# Patient Record
Sex: Male | Born: 1990 | Race: White | Hispanic: No | Marital: Single | Smoking: Never smoker
Health system: Southern US, Community
[De-identification: ages and names within clinical notes are randomized; demographics above are authoritative.]

## PROBLEM LIST (undated history)

## (undated) DIAGNOSIS — I471 Supraventricular tachycardia, unspecified: Secondary | ICD-10-CM

## (undated) DIAGNOSIS — I4891 Unspecified atrial fibrillation: Secondary | ICD-10-CM

## (undated) DIAGNOSIS — F419 Anxiety disorder, unspecified: Secondary | ICD-10-CM

## (undated) HISTORY — DX: Supraventricular tachycardia: I47.1

## (undated) HISTORY — DX: Supraventricular tachycardia, unspecified: I47.10

## (undated) HISTORY — PX: OTHER SURGICAL HISTORY: SHX169

## (undated) HISTORY — PX: WISDOM TOOTH EXTRACTION: SHX21

## (undated) HISTORY — DX: Anxiety disorder, unspecified: F41.9

---
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
## 2009-01-16 ENCOUNTER — Ambulatory Visit: Payer: Self-pay | Admitting: Gastroenterology

## 2010-02-01 ENCOUNTER — Ambulatory Visit
Admission: RE | Admit: 2010-02-01 | Discharge: 2010-02-01 | Payer: Self-pay | Source: Home / Self Care | Attending: Internal Medicine | Admitting: Internal Medicine

## 2010-02-01 ENCOUNTER — Other Ambulatory Visit: Payer: Self-pay | Admitting: Internal Medicine

## 2010-02-01 ENCOUNTER — Encounter: Payer: Self-pay | Admitting: Internal Medicine

## 2010-02-01 DIAGNOSIS — R55 Syncope and collapse: Secondary | ICD-10-CM | POA: Insufficient documentation

## 2010-02-01 DIAGNOSIS — R109 Unspecified abdominal pain: Secondary | ICD-10-CM | POA: Insufficient documentation

## 2010-02-01 DIAGNOSIS — R002 Palpitations: Secondary | ICD-10-CM | POA: Insufficient documentation

## 2010-02-01 LAB — CBC WITH DIFFERENTIAL/PLATELET
Basophils Absolute: 0 10*3/uL (ref 0.0–0.1)
Basophils Relative: 0.4 % (ref 0.0–3.0)
Eosinophils Absolute: 0 10*3/uL (ref 0.0–0.7)
Eosinophils Relative: 0.5 % (ref 0.0–5.0)
HCT: 42.9 % (ref 39.0–52.0)
Hemoglobin: 14.9 g/dL (ref 13.0–17.0)
Lymphocytes Relative: 27.8 % (ref 12.0–46.0)
Lymphs Abs: 1.2 10*3/uL (ref 0.7–4.0)
MCHC: 34.7 g/dL (ref 30.0–36.0)
MCV: 90.7 fl (ref 78.0–100.0)
Monocytes Absolute: 0.4 10*3/uL (ref 0.1–1.0)
Monocytes Relative: 9.3 % (ref 3.0–12.0)
Neutro Abs: 2.7 10*3/uL (ref 1.4–7.7)
Neutrophils Relative %: 62 % (ref 43.0–77.0)
Platelets: 179 10*3/uL (ref 150.0–400.0)
RBC: 4.73 Mil/uL (ref 4.22–5.81)
RDW: 13.4 % (ref 11.5–14.6)
WBC: 4.4 10*3/uL — ABNORMAL LOW (ref 4.5–10.5)

## 2010-02-01 LAB — BASIC METABOLIC PANEL
BUN: 13 mg/dL (ref 6–23)
CO2: 29 mEq/L (ref 19–32)
Calcium: 9 mg/dL (ref 8.4–10.5)
Chloride: 103 mEq/L (ref 96–112)
Creatinine, Ser: 0.8 mg/dL (ref 0.4–1.5)
GFR: 129.63 mL/min (ref >60.00)
Glucose, Bld: 77 mg/dL (ref 70–99)
Potassium: 3.8 mEq/L (ref 3.5–5.1)
Sodium: 139 mEq/L (ref 135–145)

## 2010-02-01 LAB — TSH: TSH: 1 u[IU]/mL (ref 0.35–5.50)

## 2010-02-02 ENCOUNTER — Encounter: Payer: Self-pay | Admitting: Cardiovascular Disease

## 2010-02-02 ENCOUNTER — Telehealth: Payer: Self-pay

## 2010-02-02 ENCOUNTER — Ambulatory Visit: Admission: RE | Admit: 2010-02-02 | Discharge: 2010-02-02 | Payer: Self-pay | Source: Home / Self Care

## 2010-02-06 ENCOUNTER — Encounter: Payer: Self-pay | Admitting: Internal Medicine

## 2010-02-06 ENCOUNTER — Telehealth: Payer: Self-pay

## 2010-02-07 ENCOUNTER — Telehealth: Payer: Self-pay

## 2010-02-28 ENCOUNTER — Telehealth (INDEPENDENT_AMBULATORY_CARE_PROVIDER_SITE_OTHER): Payer: Self-pay | Admitting: *Deleted

## 2010-03-01 NOTE — Assessment & Plan Note (Signed)
Summary: to be est/passed out 01-28-2009/njr   Vital Signs:  Patient profile:   20 year old male Weight:      142 pounds BMI:     20.45 Pulse rate:   68 / minute BP sitting:   120 / 80  (right arm)  Vitals Entered By: Kyung Rudd, CMA (February 01, 2010 1:57 PM) CC: c/o syncope episode 01/28/2010...was awakened by sharp bowel pain with sudden urge to use the restroom. Pt began to have heart palpitations and hot flash just before passing out and hitting his head. He's not sure of how long he was unconscious because he was alone. He says debated about calling ems but refrained. Pt notes he doesn't think he had a concussion from the fall.    Primary Care Provider:  Hiram Gash, MD   CC:  c/o syncope episode 01/28/2010...was awakened by sharp bowel pain with sudden urge to use the restroom. Pt began to have heart palpitations and hot flash just before passing out and hitting his head. He's not sure of how long he was unconscious because he was alone. He says debated about calling ems but refrained. Pt notes he doesn't think he had a concussion from the fall. Marland Kitchen  History of Present Illness: Patient presents to clinic as a workin for evaluation of passing out episode. States 1/2 at 5am awoke with periumbilical pain and attempted to have a BM. Experienced 20 secs of feeling hot/flushed followed by nonwitnessed LOC. May have struck head as has right forehead abrasions but denies ha, mental status change or focal neurologic deficit. Since that time has episodes of tachycardia and hard beats. Tachycardia quantified  ~90's - low 100's.  Has associated cp that occurs only with palpitations. Has chronic intermittent abdominal pain previously evaluated by GI. Not improved with by mouth medication.  Preventive Screening-Counseling & Management  Alcohol-Tobacco     Smoking Status: never     Smoking Cessation Counseling: no     Tobacco Counseling: not indicated; no tobacco use  Current Medications  (verified): 1)  None  Allergies (verified): 1)  ! Codeine Sulfate (Codeine Sulfate)  Past History:  Past medical, surgical, family and social histories (including risk factors) reviewed for relevance to current acute and chronic problems.  Past Medical History: Reviewed history from 11/13/2007 and no changes required. No past medical history  Past Surgical History: Reviewed history from 11/13/2007 and no changes required. Wisdom teeth extractions (4)  Family History: Reviewed history from 11/13/2007 and no changes required. Family History of Breast Cancer:Paternal grandmother Family History of Colon Cancer:Fathers grandmother  Social History: Reviewed history from 11/13/2007 and no changes required. Occupation: Archivist Patient has never smoked.  Alcohol Use - no Daily Caffeine Use 2 cups of sodas a day Illicit Drug Use - no Patient gets regular exercise. chews gum daily  Review of Systems      See HPI General:  Denies chills, fever, and sweats. ENT:  Denies decreased hearing, earache, hoarseness, and sinus pressure. CV:  Complains of chest pain or discomfort, fainting, and palpitations; denies lightheadness, near fainting, and shortness of breath with exertion. Resp:  Complains of chest discomfort; denies cough, shortness of breath, and wheezing. GI:  Complains of abdominal pain; denies bloody stools, change in bowel habits, constipation, dark tarry stools, diarrhea, nausea, and vomiting. Derm:  Denies changes in color of skin, flushing, and rash. Neuro:  Denies falling down, headaches, memory loss, numbness, poor balance, seizures, sensation of room spinning, tingling, tremors, visual disturbances,  and weakness.  Physical Exam  General:  Well-developed,well-nourished,in no acute distress; alert,appropriate and cooperative throughout examination Head:  Normocephalic and atraumatic without obvious abnormalities. No apparent alopecia or balding. Eyes:  pupils  equal, pupils round, corneas and lenses clear, and no injection.   Ears:  External ear exam shows no significant lesions or deformities.  Otoscopic examination reveals clear canals, tympanic membranes are intact bilaterally without bulging, retraction, inflammation or discharge. Hearing is grossly normal bilaterally. Nose:  External nasal examination shows no deformity or inflammation. Nasal mucosa are pink and moist without lesions or exudates. Mouth:  Oral mucosa and oropharynx without lesions or exudates.  Teeth in good repair. Neck:  No deformities, masses, or tenderness noted. Lungs:  Normal respiratory effort, chest expands symmetrically. Lungs are clear to auscultation, no crackles or wheezes. Heart:  Normal rate and regular rhythm. S1 and S2 normal without gallop, murmur, click, rub or other extra sounds. Abdomen:  Bowel sounds positive,abdomen soft and non-tender without masses, organomegaly or hernias noted. Neurologic:  alert & oriented X3, cranial nerves II-XII intact, strength normal in all extremities, and gait normal.   Skin:  turgor normal, color normal, and no rashes.     Impression & Recommendations:  Problem # 1:  SYNCOPE (ICD-780.2) Assessment New EKG obtained demosntrates NSR 72 with nl intervals. ?LVH by voltage. Obtain labs and schedule 24 holter monitor given continuing palpitations. Pt states is moving out of state in several days. Strongly encouraged to establish PMD locally. If followup EKG c/w LVH recommend echocardiogram.   Orders: Cardiology Referral (Cardiology) Specimen Handling (16109) Venipuncture (60454) TLB-BMP (Basic Metabolic Panel-BMET) (80048-METABOL) TLB-CBC Platelet - w/Differential (85025-CBCD) TLB-TSH (Thyroid Stimulating Hormone) (84443-TSH)  Problem # 2:  ABDOMINAL PAIN, CHRONIC (ICD-789.00) Assessment: Unchanged GI records reviewed. Persistent symptoms failing by mouth meds. GI previous mentioned colonoscopy in this case. Pt states  understanding. Again is moving out of state in very near future and will f/u locally for further evaluation.   Orders Added: 1)  Cardiology Referral [Cardiology] 2)  Specimen Handling [99000] 3)  Venipuncture [36415] 4)  TLB-BMP (Basic Metabolic Panel-BMET) [80048-METABOL] 5)  TLB-CBC Platelet - w/Differential [85025-CBCD] 6)  TLB-TSH (Thyroid Stimulating Hormone) [84443-TSH] 7)  New Patient Level IV [09811]

## 2010-03-01 NOTE — Procedures (Signed)
Summary: Summary Report  Summary Report   Imported By: Erle Crocker 02/14/2010 16:24:59  _____________________________________________________________________  External Attachment:    Type:   Image     Comment:   External Document

## 2010-03-01 NOTE — Progress Notes (Signed)
Summary: Pt req Toprol to be sent to [facility] in [facility] Note Call from Patient Call back at [phone] cell   Caller: Patient Summary of Call: Pt called and said that he is in [place] for 6 months, so he is req Toprol XL 25mg  to be forward to [facility] [address] Kathie Rhodes. in [place] [phone]  Phone # [phone]  . Pls notify pt when this has been done.  Initial call taken by: Lucy Antigua,  February 07, 2010 10:09 AM  Follow-up for Phone Call        rx escribed and pt aware Follow-up by: Kyung Rudd, CMA,  February 07, 2010 11:48 AM    New/Updated Medications: TOPROL XL 25 MG XR24H-TAB (METOPROLOL SUCCINATE) once daily Prescriptions: TOPROL XL 25 MG XR24H-TAB (METOPROLOL SUCCINATE) once daily  #30 x 1   Entered by:   Kyung Rudd, CMA   Authorized by:   Edwyna Perfect MD   Signed by:   Kyung Rudd, CMA on 02/07/2010   Method used:   Electronically to        [facility])       [address]       [place], [place]  [address]       Ph: [phone]       Fax:    RxID:   [id number]

## 2010-03-01 NOTE — Progress Notes (Signed)
----   Converted from flag ---- ---- 02/06/2010 12:13 PM, Edwyna Perfect MD wrote: cardiologist read his holter monitor. is having episodes of SVT (rapid heart rhythm that can lead to passing out, dizziness and chest pain.)  1) recommend beginning toprol xl 25mg  once daily to try and reduce attacks. 2) absolutely needs to get local doctor (currently in [place]) asap. get him to give a name and address so we can forward his note, labs and holter monitor report. ------------------------------  Phone Note Outgoing Call   Call placed by: Kyung Rudd, CMA,  February 06, 2010 2:12 PM Call placed to: Patient Details for Reason: results Summary of Call: pt aware of all. Is currently in [place] traveling to [place]. Notes that he will get an appt with a doctor asap and will call us back with the offc and pharm info.  Initial call taken by: Kyung Rudd, CMA,  February 06, 2010 2:14 PM

## 2010-03-01 NOTE — Procedures (Signed)
Summary: summary report  summary report   Imported By: Mirna Mires 02/06/2010 10:39:51  _____________________________________________________________________  External Attachment:    Type:   Image     Comment:   External Document

## 2010-03-01 NOTE — Progress Notes (Signed)
----   Converted from flag ---- ---- 02/01/2010 5:35 PM, Edwyna Perfect MD wrote: pls notify labs nl ------------------------------  Phone Note Outgoing Call   Call placed by: Kyung Rudd, CMA,  February 02, 2010 9:43 AM Call placed to: Patient Details for Reason: lab results Summary of Call: phone number listed in chart is continuously busy. will continue to try calling Initial call taken by: Kyung Rudd, CMA,  February 02, 2010 9:44 AM  Follow-up for Phone Call        pt aware Follow-up by: Kyung Rudd, CMA,  February 02, 2010 1:37 PM

## 2010-03-07 NOTE — Progress Notes (Signed)
  Holter Faxed to [facility] @ [phone] ROI On file  [facility]  February 28, 2010 9:09 AM

## 2010-03-07 NOTE — Procedures (Signed)
Summary: Summary Report  Summary Report   Imported By: Erle Crocker 02/28/2010 14:17:29  _____________________________________________________________________  External Attachment:    Type:   Image     Comment:   External Document

## 2011-10-04 ENCOUNTER — Encounter: Payer: Self-pay | Admitting: Cardiovascular Disease

## 2017-12-24 ENCOUNTER — Ambulatory Visit: Payer: BC Managed Care – PPO | Admitting: Psychology

## 2017-12-24 DIAGNOSIS — F320 Major depressive disorder, single episode, mild: Secondary | ICD-10-CM | POA: Diagnosis not present

## 2018-01-07 ENCOUNTER — Ambulatory Visit: Payer: BC Managed Care – PPO | Admitting: Psychology

## 2018-01-19 ENCOUNTER — Ambulatory Visit: Payer: BC Managed Care – PPO | Admitting: Psychology

## 2018-01-19 DIAGNOSIS — F320 Major depressive disorder, single episode, mild: Secondary | ICD-10-CM | POA: Diagnosis not present

## 2018-02-03 ENCOUNTER — Ambulatory Visit: Payer: BC Managed Care – PPO | Admitting: Psychology

## 2018-02-03 DIAGNOSIS — F320 Major depressive disorder, single episode, mild: Secondary | ICD-10-CM | POA: Diagnosis not present

## 2018-02-17 ENCOUNTER — Ambulatory Visit: Payer: BC Managed Care – PPO | Admitting: Psychology

## 2018-02-17 DIAGNOSIS — F320 Major depressive disorder, single episode, mild: Secondary | ICD-10-CM | POA: Diagnosis not present

## 2019-12-14 DIAGNOSIS — Z03818 Encounter for observation for suspected exposure to other biological agents ruled out: Secondary | ICD-10-CM | POA: Diagnosis not present

## 2019-12-14 DIAGNOSIS — Z20822 Contact with and (suspected) exposure to covid-19: Secondary | ICD-10-CM | POA: Diagnosis not present

## 2019-12-15 DIAGNOSIS — F419 Anxiety disorder, unspecified: Secondary | ICD-10-CM | POA: Diagnosis not present

## 2019-12-15 DIAGNOSIS — R000 Tachycardia, unspecified: Secondary | ICD-10-CM | POA: Diagnosis not present

## 2019-12-15 DIAGNOSIS — F321 Major depressive disorder, single episode, moderate: Secondary | ICD-10-CM | POA: Diagnosis not present

## 2019-12-15 DIAGNOSIS — Z13220 Encounter for screening for lipoid disorders: Secondary | ICD-10-CM | POA: Diagnosis not present

## 2019-12-15 DIAGNOSIS — Z0000 Encounter for general adult medical examination without abnormal findings: Secondary | ICD-10-CM | POA: Diagnosis not present

## 2019-12-21 DIAGNOSIS — Z20822 Contact with and (suspected) exposure to covid-19: Secondary | ICD-10-CM | POA: Diagnosis not present

## 2020-02-08 DIAGNOSIS — Z1152 Encounter for screening for COVID-19: Secondary | ICD-10-CM | POA: Diagnosis not present

## 2020-06-14 DIAGNOSIS — E7800 Pure hypercholesterolemia, unspecified: Secondary | ICD-10-CM | POA: Diagnosis not present

## 2020-07-12 DIAGNOSIS — J301 Allergic rhinitis due to pollen: Secondary | ICD-10-CM | POA: Diagnosis not present

## 2020-07-12 DIAGNOSIS — J3089 Other allergic rhinitis: Secondary | ICD-10-CM | POA: Diagnosis not present

## 2020-07-12 DIAGNOSIS — T781XXD Other adverse food reactions, not elsewhere classified, subsequent encounter: Secondary | ICD-10-CM | POA: Diagnosis not present

## 2020-07-12 DIAGNOSIS — J3081 Allergic rhinitis due to animal (cat) (dog) hair and dander: Secondary | ICD-10-CM | POA: Diagnosis not present

## 2020-09-20 DIAGNOSIS — D2261 Melanocytic nevi of right upper limb, including shoulder: Secondary | ICD-10-CM | POA: Diagnosis not present

## 2020-09-20 DIAGNOSIS — D224 Melanocytic nevi of scalp and neck: Secondary | ICD-10-CM | POA: Diagnosis not present

## 2020-09-20 DIAGNOSIS — D485 Neoplasm of uncertain behavior of skin: Secondary | ICD-10-CM | POA: Diagnosis not present

## 2020-09-20 DIAGNOSIS — D225 Melanocytic nevi of trunk: Secondary | ICD-10-CM | POA: Diagnosis not present

## 2020-09-20 DIAGNOSIS — L400 Psoriasis vulgaris: Secondary | ICD-10-CM | POA: Diagnosis not present

## 2020-10-10 ENCOUNTER — Other Ambulatory Visit: Payer: Self-pay

## 2020-10-10 ENCOUNTER — Ambulatory Visit: Payer: BC Managed Care – PPO | Admitting: Psychology

## 2020-10-10 DIAGNOSIS — F320 Major depressive disorder, single episode, mild: Secondary | ICD-10-CM

## 2020-10-23 DIAGNOSIS — F902 Attention-deficit hyperactivity disorder, combined type: Secondary | ICD-10-CM | POA: Diagnosis not present

## 2020-10-23 DIAGNOSIS — F419 Anxiety disorder, unspecified: Secondary | ICD-10-CM | POA: Diagnosis not present

## 2020-10-23 DIAGNOSIS — Z79899 Other long term (current) drug therapy: Secondary | ICD-10-CM | POA: Diagnosis not present

## 2020-10-23 DIAGNOSIS — R41840 Attention and concentration deficit: Secondary | ICD-10-CM | POA: Diagnosis not present

## 2020-10-24 DIAGNOSIS — F902 Attention-deficit hyperactivity disorder, combined type: Secondary | ICD-10-CM | POA: Diagnosis not present

## 2020-10-31 ENCOUNTER — Ambulatory Visit: Payer: BC Managed Care – PPO | Admitting: Psychology

## 2020-11-01 ENCOUNTER — Ambulatory Visit (INDEPENDENT_AMBULATORY_CARE_PROVIDER_SITE_OTHER): Payer: BC Managed Care – PPO | Admitting: Psychology

## 2020-11-01 DIAGNOSIS — F320 Major depressive disorder, single episode, mild: Secondary | ICD-10-CM

## 2020-11-24 ENCOUNTER — Ambulatory Visit (INDEPENDENT_AMBULATORY_CARE_PROVIDER_SITE_OTHER): Payer: BC Managed Care – PPO | Admitting: Psychology

## 2020-11-24 DIAGNOSIS — F320 Major depressive disorder, single episode, mild: Secondary | ICD-10-CM

## 2020-11-27 DIAGNOSIS — F419 Anxiety disorder, unspecified: Secondary | ICD-10-CM | POA: Diagnosis not present

## 2020-11-27 DIAGNOSIS — Z79899 Other long term (current) drug therapy: Secondary | ICD-10-CM | POA: Diagnosis not present

## 2020-11-27 DIAGNOSIS — F902 Attention-deficit hyperactivity disorder, combined type: Secondary | ICD-10-CM | POA: Diagnosis not present

## 2020-11-28 DIAGNOSIS — F902 Attention-deficit hyperactivity disorder, combined type: Secondary | ICD-10-CM | POA: Diagnosis not present

## 2020-12-12 ENCOUNTER — Ambulatory Visit: Payer: BC Managed Care – PPO | Admitting: Psychology

## 2020-12-12 DIAGNOSIS — F320 Major depressive disorder, single episode, mild: Secondary | ICD-10-CM

## 2020-12-15 DIAGNOSIS — I4729 Other ventricular tachycardia: Secondary | ICD-10-CM | POA: Diagnosis not present

## 2020-12-19 ENCOUNTER — Encounter: Payer: Self-pay | Admitting: Internal Medicine

## 2020-12-19 ENCOUNTER — Other Ambulatory Visit: Payer: Self-pay

## 2020-12-19 ENCOUNTER — Ambulatory Visit (INDEPENDENT_AMBULATORY_CARE_PROVIDER_SITE_OTHER): Payer: BC Managed Care – PPO

## 2020-12-19 ENCOUNTER — Ambulatory Visit (INDEPENDENT_AMBULATORY_CARE_PROVIDER_SITE_OTHER): Payer: BC Managed Care – PPO | Admitting: Internal Medicine

## 2020-12-19 VITALS — BP 122/76 | HR 88 | Resp 20 | Ht 71.0 in | Wt 209.2 lb

## 2020-12-19 DIAGNOSIS — I471 Supraventricular tachycardia: Secondary | ICD-10-CM

## 2020-12-19 NOTE — Progress Notes (Signed)
Cardiology Office Note:    Date:  12/19/2020   ID:  Harry Anderson, DOB 1990-08-10, MRN [id number]  PCP:  Harry Bussing, MD   [facility] Providers Cardiologist:  None     Referring MD: Harry Palmer, MD   No chief complaint on file. Hx of SVT  History of Present Illness:    Harry Anderson is a 30 y.o. male with a hx of anxiety, ADHD  Reported syncopal episode in 2012. He woke up with bowel pain and sudden urge to defecate and noted palpitations and hot flash. He then reported passing out and loosing consciousness. He had a holter monitor read has baseline rhythm as sinus, with sinus bradycardia as well as episode of SVT. He was prescribed metoprolol XL 25 mg daily. He was told to stop it in  [place] in 2012 and it was stopped. He saw a cardiologist in 2015-2016 in [place]. He had a repeat 4 week monitor. Noted runs of sinus tachycardia in the evening 08/2014. Rates up to 157 noted as sinus tachycardia. 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 He had a bubble study in [place]. No records of his echocardiogram.   He is on adderral. He has increased heart rates since recently starting.  His metoprolol was increased to 25 mg XL. When he does not take it  he feels palpitations. He denies heart racing with LH, dizziness, no repeat syncope.  He drinks 2-3x per week. He drinks 2 servings of caffeine per day. He's a teacher  Holter 2012- 24 runs of SVT. Max rate 115 bpm. Longets run was 24 beats.  Family Hx: His mother had an aortic valve and root replacement. She was diagnosed with afib. S/p ppm. His sister has POTs and Ehrlers danlos. No hx of SCD in 1st degree relatives  12/15/2020 Crt 0.93 K 4.3 Hgb 15 LDL 141 (age too young to assess risk with pooled cohort equation; not in familial hyperlipidemia range) TC 215  TSH 1.27  Past Medical History:  Diagnosis Date   Anxiety     SVT (supraventricular tachycardia) (HCC)     Past Surgical History:  Procedure Laterality Date   Wisdom teeth removal     WISDOM TOOTH EXTRACTION      Current Medications: Current Meds  Medication Sig   amphetamine-dextroamphetamine (ADDERALL XR) 30 MG 24 hr capsule Take 30 mg by mouth daily.   buPROPion (WELLBUTRIN) 100 MG tablet Take 100 mg by mouth daily.   metoprolol succinate (TOPROL-XL) 25 MG 24 hr tablet Take 25 mg by mouth daily. Pt takes 12.5 mg daily   sertraline (ZOLOFT) 100 MG tablet Take 100 mg by mouth daily.     Allergies:   Codeine sulfate   Social History   Socioeconomic History   Marital status: Single    Spouse name: Not on file   Number of children: Not on file   Years of education: Not on file   Highest education level: Not on file  Occupational History   Not on file  Tobacco Use   Smoking status: Never   Smokeless tobacco: Not on file  Substance and Sexual Activity   Alcohol use: No   Drug use: Not on file   Sexual activity: Never  Other Topics Concern   Not on file  Social History Narrative   Not on file   Social Determinants of Health  Financial Resource Strain: Not on file  Food Insecurity: Not on file  Transportation Needs: Not on file  Physical Activity: Not on file  Stress: Not on file  Social Connections: Not on file     Family History: The patient's family history includes Atrial fibrillation in his mother; Breast cancer in an other family member; Colon cancer in an other family member; Ehlers-Danlos syndrome in his sister; Heart attack in his maternal grandfather.  ROS:   Please see the history of present illness.     All other systems reviewed and are negative.  EKGs/Labs/Other Studies Reviewed:    The following studies were reviewed today:   EKG:  EKG is  ordered today.  The ekg ordered today demonstrates   NSR, no pre-excitation, Qtc 433 ms  Recent Labs: No results found for requested labs within last 8760  hours.  Recent Lipid Panel No results found for: CHOL, TRIG, HDL, CHOLHDL, VLDL, LDLCALC, LDLDIRECT   Risk Assessment/Calculations:           Physical Exam:    VS:   Vitals:   12/19/20 1121  BP: 122/76  Pulse: 88  Resp: 20     Wt Readings from Last 3 Encounters:  12/19/20 209 lb 3.2 oz (94.9 kg)  02/01/10 142 lb (64.4 kg)     GEN:  Well nourished, well developed in no acute distress HEENT: Normal NECK: No JVD; No carotid bruits LYMPHATICS: No lymphadenopathy CARDIAC: RRR, no murmurs, rubs, gallops RESPIRATORY:  Clear to auscultation without rales, wheezing or rhonchi  ABDOMEN: Soft, non-tender, non-distended MUSCULOSKELETAL:  No edema; No deformity  SKIN: Warm and dry NEUROLOGIC:  Alert and oriented x 3 PSYCHIATRIC:  Normal affect   ASSESSMENT:    #SVT: noted episode of SVT on holter monitor 10 years ago. He had a brief run up to 24 beats and max rate only 115 bpm. He's had no recent syncopal episode. The episode in 2012 is consistent with vasovagal syncope. He notes that with stopping metoprolol he notes some notable HR. He takes adderal at times.Will plan to stop his metoprolol and can repeat a 14  day monitor. He plans to hold his adderal as well during this period.  #Family Hx: considering his mother had aortic valve and root replacement and his sister has ehlers danlos will plan to get TTE to look  for BAV/aortic root.  PLAN:    In order of problems listed above:  Stop Metoprolol, going to hold adderall  14 days cardiac event monitor to ensure no persistent runs of SVT off of metop and adderal TTE Follow up in 3 months      Medication Adjustments/Labs and Tests Ordered: Current medicines are reviewed at length with the patient today.  Concerns regarding medicines are outlined above.    Signed, Maisie Fus, MD  12/19/2020 12:00 PM    [facility]

## 2020-12-19 NOTE — Patient Instructions (Signed)
Medication Instructions:  The current medical regimen is effective;  continue present plan and medications.  *If you need a refill on your cardiac medications before your next appointment, please call your pharmacy*   Testing/Procedures: Echocardiogram - Your physician has requested that you have an echocardiogram. Echocardiography is a painless test that uses sound waves to create images of your heart. It provides your doctor with information about the size and shape of your heart and how well your heart's chambers and valves are working. This procedure takes approximately one hour. There are no restrictions for this procedure. This will be performed at either our [facility] location - [address]  -or- [facility] location [facility] 2nd floor.  ZIO XT- Long Term Monitor Instructions  Your physician has requested you wear a ZIO patch monitor for 14 days.  This is a single patch monitor. [employer] supplies one patch monitor per enrollment. Additional stickers are not available. Please do not apply patch if you will be having a Nuclear Stress Test,  Echocardiogram, Cardiac CT, MRI, or Chest Xray during the period you would be wearing the  monitor. The patch cannot be worn during these tests. You cannot remove and re-apply the  ZIO XT patch monitor.  Your ZIO patch monitor will be mailed 3 day USPS to your address on file. It may take 3-5 days  to receive your monitor after you have been enrolled.  Once you have received your monitor, please review the enclosed instructions. Your monitor  has already been registered assigning a specific monitor serial # to you.  Billing and Patient Assistance Program Information  We have supplied [employer] with any of your insurance information on file for billing purposes. [employer] offers a sliding scale Patient Assistance Program for patients that do not have  insurance, or whose insurance does not completely cover the cost of the  ZIO monitor.  You must apply for the Patient Assistance Program to qualify for this discounted rate.  To apply, please call [employer] at [phone], select option 4, select option 2, ask to apply for  Patient Assistance Program. Meredeth Ide will ask your household income, and how many people  are in your household. They will quote your out-of-pocket cost based on that information.  [employer] will also be able to set up a 17-month, interest-free payment plan if needed.  Applying the monitor   Shave hair from upper left chest.  Hold abrader disc by orange tab. Rub abrader in 40 strokes over the upper left chest as  indicated in your monitor instructions.  Clean area with 4 enclosed alcohol pads. Let dry.  Apply patch as indicated in monitor instructions. Patch will be placed under collarbone on left  side of chest with arrow pointing upward.  Rub patch adhesive wings for 2 minutes. Remove white label marked "1". Remove the white  label marked "2". Rub patch adhesive wings for 2 additional minutes.  While looking in a mirror, press and release button in center of patch. A small green light will  flash 3-4 times. This will be your only indicator that the monitor has been turned on.  Do not shower for the first 24 hours. You may shower after the first 24 hours.  Press the button if you feel a symptom. You will hear a small click. Record Date, Time and  Symptom in the Patient Logbook.  When you are ready to remove the patch, follow instructions on the last 2 pages of Patient  Logbook. Stick patch monitor onto the last page of Patient Logbook.  Place Patient Logbook in the blue and white box. Use locking tab on box and tape box closed  securely. The blue and white box has prepaid postage on it. Please place it in the mailbox as  soon as possible. Your physician should have your test results approximately 7 days after the  monitor has been mailed back to [facility]  Call [facility] Customer  Care at [phone] if you have questions regarding  your ZIO XT patch monitor. Call them immediately if you see an orange light blinking on your  monitor.  If your monitor falls off in less than 4 days, contact our Monitor department at [phone].  If your monitor becomes loose or falls off after 4 days call [employer] at [phone] for  suggestions on securing your monitor    Follow-Up: At [facility], you and your health needs are our priority.  As part of our continuing mission to provide you with exceptional heart care, we have created designated Provider Care Teams.  These Care Teams include your primary Cardiologist (physician) and Advanced Practice Providers (APPs -  Physician Assistants and Nurse Practitioners) who all work together to provide you with the care you need, when you need it.  We recommend signing up for the patient portal called "MyChart".  Sign up information is provided on this After Visit Summary.  MyChart is used to connect with patients for Virtual Visits (Telemedicine).  Patients are able to view lab/test results, encounter notes, upcoming appointments, etc.  Non-urgent messages can be sent to your provider as well.   To learn more about what you can do with MyChart, go to [email or web address].    Your next appointment:   3 month(s)  The format for your next appointment:   In Person  Provider:   Carolan Clines, MD

## 2020-12-19 NOTE — Progress Notes (Unsigned)
Enrolled patient for a 14 day Zio XT  monitor to be mailed to patients home  °

## 2020-12-22 DIAGNOSIS — I471 Supraventricular tachycardia: Secondary | ICD-10-CM

## 2020-12-26 ENCOUNTER — Ambulatory Visit: Payer: BC Managed Care – PPO | Admitting: Psychology

## 2021-01-01 ENCOUNTER — Other Ambulatory Visit (HOSPITAL_BASED_OUTPATIENT_CLINIC_OR_DEPARTMENT_OTHER): Payer: BC Managed Care – PPO

## 2021-01-04 ENCOUNTER — Encounter: Payer: Self-pay | Admitting: Internal Medicine

## 2021-01-08 ENCOUNTER — Ambulatory Visit (INDEPENDENT_AMBULATORY_CARE_PROVIDER_SITE_OTHER): Payer: BC Managed Care – PPO

## 2021-01-08 ENCOUNTER — Other Ambulatory Visit: Payer: Self-pay

## 2021-01-08 DIAGNOSIS — I471 Supraventricular tachycardia: Secondary | ICD-10-CM | POA: Diagnosis not present

## 2021-01-08 LAB — ECHOCARDIOGRAM COMPLETE
AR max vel: 3.15 cm2
AV Area VTI: 3.21 cm2
AV Area mean vel: 3.44 cm2
AV Mean grad: 2 mmHg
AV Peak grad: 4.2 mmHg
Ao pk vel: 1.03 m/s
Area-P 1/2: 3.81 cm2
Calc EF: 68.5 %
S' Lateral: 3.01 cm
Single Plane A2C EF: 70.5 %
Single Plane A4C EF: 68.2 %

## 2021-01-09 ENCOUNTER — Ambulatory Visit (INDEPENDENT_AMBULATORY_CARE_PROVIDER_SITE_OTHER): Payer: BC Managed Care – PPO | Admitting: Psychology

## 2021-01-09 DIAGNOSIS — F320 Major depressive disorder, single episode, mild: Secondary | ICD-10-CM

## 2021-01-09 NOTE — Progress Notes (Addendum)
[facility] Counselor/Therapist Progress Note  Patient ID: Harry Anderson, MRN: [id number],    Date: 01/09/2021  Time Spent: 50 mins  Treatment Type: Individual Therapy  Reported Symptoms: Pt presents for session, in person, at our office and grants consent for session.  Pt shares that he has been OK since our last session; "no major crises with Thanksgiving."  Pt and [name] went to her father's home for Thanksgiving; his dad joined them but his mom did not.  Pt believes that his mom went to her sister's for the holiday.  Mental Status Exam: Appearance:  Casual     Behavior: Appropriate  Motor: Normal  Speech/Language:  Clear and Coherent  Affect: Appropriate  Mood: normal  Thought process: normal  Thought content:   WNL  Sensory/Perceptual disturbances:   WNL  Orientation: oriented to person, place, and time/date  Attention: Good  Concentration: Good  Memory: WNL  Fund of knowledge:  Good  Insight:   Good  Judgment:  Good  Impulse Control: Good   Risk Assessment: Danger to Self:  No Self-injurious Behavior: No Danger to Others: No Duty to Warn:no Physical Aggression / Violence:No  Access to Firearms a concern: No  Gang Involvement:No   Subjective: Pt shares that he is doing well since our last session.  He is settling into his new job and likes it better than his pervious position.  "It is definitely easier than the old job."  Pt did review [name]'s email and wants to begin working through his "family issues."  Pt shares that he believes his mom is he first place to start.  He believes his mom "has always been depressed."  "My mom was rarely wrong in her mind.  She had a series of health issues back in the 90's.  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  I am not sure where they were with the other ones."  Pt shares he wished his mom was more engaged with pt in his life.  He shares she was  overweight and in poor health for his younger years and he attributes her lack of engagement to those concerns.  Pt shares that "we were not a family to talk about our feelings; we were pretty emotionally empty house."  Pt shares he recognizes that he is "emotionally unavailable to [name] because of the way I grew up."  Pt shares that he does process his feelings/emotions and always comes back to her with his ideas.  Pt shares that his current relationship with his mom is "closely enter twined with my relationship with my sister Irving Burton).  My relationship with Irving Burton is estranged at this point."  Pt shares that he feels that he is more distant from his mom because his mom is closer to [place] and wants pt to make up with Irving Burton."  Pt does not feel a need right now to mend his relationship with Irving Burton.  Pt shares that he had some cardiology tests done yesterday and the results were OK.  Pt has a history of an elevated heart rate for some time.  Pt shares that he and [name] continue to plan the wedding that he enjoys; they also do bar trivia a couple of times per week with friends.  Encouraged pt to think about what more he might engage with for his self care activities and also consider what else he is willing to do to have his  relationship with his mom to be better than it is now.  We will meet in 3 wks for a follow up session due to pt's trip to [place] for a friend's wedding.  Interventions: Cognitive Behavioral Therapy  Diagnosis:Current mild episode of major depressive disorder without prior episode (HCC)  Plan:  Patient is to use CBT, mindfulness and coping skills to help manage decrease symptoms associated with their diagnosis.   Long-term goal:   Reduce overall level, frequency, and intensity of the feelings of depression evidenced by decreased irritability, negative self talk, and helpless feelings from 6 to 7 days/week to 0 to 1 days/week per client report for at least 3 consecutive months.   Short-term  goal:  Verbally express understanding of the relationship between feelings of depression and it's impact on thinking patterns and behaviors. Verbalize an understanding of the role that distorted thinking plays in creating fears, excessive worry, and ruminations.  Karie Kirks, [facility]

## 2021-01-12 ENCOUNTER — Encounter: Payer: Self-pay | Admitting: Internal Medicine

## 2021-01-18 ENCOUNTER — Other Ambulatory Visit: Payer: Self-pay | Admitting: Internal Medicine

## 2021-01-18 NOTE — Progress Notes (Signed)
Called back Harry Anderson about his metoprolol, we agreed for him to trial his metop XL 12.5 mg daily with symptoms and increase to a full pill if that does not work.

## 2021-01-19 NOTE — Addendum Note (Signed)
Addended by: Maggie Font B on: 01/19/2021 08:50 PM   Modules accepted: Level of Service

## 2021-01-23 ENCOUNTER — Ambulatory Visit: Payer: BC Managed Care – PPO | Admitting: Psychology

## 2021-01-30 ENCOUNTER — Ambulatory Visit (INDEPENDENT_AMBULATORY_CARE_PROVIDER_SITE_OTHER): Payer: BC Managed Care – PPO | Admitting: Psychology

## 2021-01-30 DIAGNOSIS — F320 Major depressive disorder, single episode, mild: Secondary | ICD-10-CM | POA: Diagnosis not present

## 2021-01-30 NOTE — Progress Notes (Signed)
[facility] Counselor/Therapist Progress Note  Patient ID: Harry Anderson, MRN: [id number],    Date: 01/30/2021  Time Spent: 50 mins  Treatment Type: Individual Therapy  Reported Symptoms: Pt presents for session, in person, at our office and grants consent for session.  Pt shares that he has been good since our last session.  They went to [facility] for a friend's wedding and he enjoyed seeing some of those friends.    Mental Status Exam: Appearance:  Casual     Behavior: Appropriate  Motor: Normal  Speech/Language:  Clear and Coherent  Affect: Appropriate  Mood: normal  Thought process: normal  Thought content:   WNL  Sensory/Perceptual disturbances:   WNL  Orientation: oriented to person, place, and time/date  Attention: Good  Concentration: Good  Memory: WNL  Fund of knowledge:  Good  Insight:   Good  Judgment:  Good  Impulse Control: Good   Risk Assessment: Danger to Self:  No Self-injurious Behavior: No Danger to Others: No Duty to Warn:no Physical Aggression / Violence:No  Access to Firearms a concern: No  Gang Involvement:No   Subjective: Pt shares that he wants to talk about his sister Irving Burton) today and to help decide if he should invite her to their wedding.  They are printing wedding invitations at this point, "and I need to make a decision about this issue."  Pt shares he has not talked to her since 11/2018.  "I am not avoiding her, I just don't want to interact with her."  Pt shares that his sister is the aggressor if there is an argument.  Talked through ways to invite her and include in the ceremony (scheduled for May).  Pt will consider what he wants to do.  Advised pt that if he does invite Irving Burton, that would be a gracious thing for him to do.  Pt shares that he has sent Irving Burton "Happy Birthday wishes" in the past couple of years.  Pt will continue to talk with [name] and they will make a decision on what is best for them as a couple.  Encouraged pt  to continue with his self care activities and we will meet in 2 wks for a follow up session.  Interventions: Cognitive Behavioral Therapy  Diagnosis:Current mild episode of major depressive disorder without prior episode [facility])  Plan:  Treatment Plan Strengths/Abilities:  Intelligent, Intuitive, Willing to participate in therapy Treatment Preferences:  Outpatient Individual Therapy Statement of Needs:  Patient is to use CBT, mindfulness and coping skills to help manage and/or decrease symptoms associated with their diagnosis. Symptoms:  Depressed/Irritable mood, worry, social withdrawal Problems Addressed:  Depressive thoughts, Sadness, Sleep issues, etc. Long Term Goals:  Pt to reduce overall level, frequency, and intensity of the feelings of depression as evidenced by decreased irritability, negative self talk, and helpless feelings from 6 to 7 days/week to 0 to 1 days/week, per client report, for at least 3 consecutive months.  Progress: 20% Short Term Goals:  Pt to verbally express understanding of the relationship between feelings of depression and their impact on thinking patterns and behaviors.  Pt to verbalize an understanding of the role that distorted thinking plays in creating fears, excessive worry, and ruminations.  Progress: 20% Target Date:  01/30/2022 Frequency:  Bi-weekly Modality:  Cognitive Behavioral Therapy Interventions by Therapist:  Therapist will use CBT, Mindfulness exercises, Coping skills and Referrals, as needed by client. Client has verbally approved this treatment plan. Karie Kirks, [facility]

## 2021-01-31 DIAGNOSIS — Z79899 Other long term (current) drug therapy: Secondary | ICD-10-CM | POA: Diagnosis not present

## 2021-01-31 DIAGNOSIS — F419 Anxiety disorder, unspecified: Secondary | ICD-10-CM | POA: Diagnosis not present

## 2021-01-31 DIAGNOSIS — F902 Attention-deficit hyperactivity disorder, combined type: Secondary | ICD-10-CM | POA: Diagnosis not present

## 2021-02-06 ENCOUNTER — Ambulatory Visit: Payer: BC Managed Care – PPO | Admitting: Psychology

## 2021-02-13 ENCOUNTER — Ambulatory Visit: Payer: BC Managed Care – PPO | Admitting: Psychology

## 2021-02-13 DIAGNOSIS — F320 Major depressive disorder, single episode, mild: Secondary | ICD-10-CM

## 2021-02-13 NOTE — Progress Notes (Signed)
[facility] Counselor/Therapist Progress Note  Patient ID: Harry Anderson, MRN: [id number],    Date: 02/13/2021  Time Spent: 50 mins  Treatment Type: Individual Therapy  Reported Symptoms: Pt presents for session, in person, at our office and grants consent for session.  Pt shares that he has been good since our last session.  Mental Status Exam: Appearance:  Casual     Behavior: Appropriate  Motor: Normal  Speech/Language:  Clear and Coherent  Affect: Appropriate  Mood: normal  Thought process: normal  Thought content:   WNL  Sensory/Perceptual disturbances:   WNL  Orientation: oriented to person, place, and time/date  Attention: Good  Concentration: Good  Memory: WNL  Fund of knowledge:  Good  Insight:   Good  Judgment:  Good  Impulse Control: Good   Risk Assessment: Danger to Self:  No Self-injurious Behavior: No Danger to Others: No Duty to Warn:no Physical Aggression / Violence:No  Access to Firearms a concern: No  Gang Involvement:No   Subjective: Pt shares that he felt like our conversation last session about pt and Irving Burton was helpful for him.  He did send her an invitation to the wedding and will wait to see what her decision is about attending.  Pt shares that [name] is more stressed by the wedding plans than pt is.  Pt shares that [name] is stressed by her older sister "copying" what [name] does (engagement, wedding planning, etc.).  [name] is wanting to make the wedding as special as it can be and pt is trying to help out as much as he can. Pt shares his ADHD medications are helping him at work and also with the parts of the wedding that [name] has delegated to pt.  Pt is taking Vyvanse right now and he likes the results better than Aderall or Concerta.  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  Pt is likely to be in line for a promotion  after this proposal, presuming it goes well.  Encouraged pt to continue with his self care activities and we will meet in 4 wks for a follow up session, due to his presentation at work in 2 wks.  Interventions: Cognitive Behavioral Therapy  Diagnosis:Current mild episode of major depressive disorder without prior episode [facility])  Plan:  Treatment Plan Strengths/Abilities:  Intelligent, Intuitive, Willing to participate in therapy Treatment Preferences:  Outpatient Individual Therapy Statement of Needs:  Patient is to use CBT, mindfulness and coping skills to help manage and/or decrease symptoms associated with their diagnosis. Symptoms:  Depressed/Irritable mood, worry, social withdrawal Problems Addressed:  Depressive thoughts, Sadness, Sleep issues, etc. Long Term Goals:  Pt to reduce overall level, frequency, and intensity of the feelings of depression as evidenced by decreased irritability, negative self talk, and helpless feelings from 6 to 7 days/week to 0 to 1 days/week, per client report, for at least 3 consecutive months.  Progress: 20% Short Term Goals:  Pt to verbally express understanding of the relationship between feelings of depression and their impact on thinking patterns and behaviors.  Pt to verbalize an understanding of the role that distorted thinking plays in creating fears, excessive worry, and ruminations.  Progress: 20% Target Date:  01/30/2022 Frequency:  Bi-weekly Modality:  Cognitive Behavioral Therapy Interventions by Therapist:  Therapist will use CBT, Mindfulness exercises, Coping skills and Referrals, as needed by client. Client has verbally  approved this treatment plan. Karie Kirks, [facility]                  Karie Kirks, [facility]

## 2021-02-20 ENCOUNTER — Ambulatory Visit: Payer: BC Managed Care – PPO | Admitting: Psychology

## 2021-02-27 ENCOUNTER — Ambulatory Visit: Payer: BC Managed Care – PPO | Admitting: Psychology

## 2021-03-06 ENCOUNTER — Ambulatory Visit: Payer: BC Managed Care – PPO | Admitting: Psychology

## 2021-03-13 ENCOUNTER — Ambulatory Visit (INDEPENDENT_AMBULATORY_CARE_PROVIDER_SITE_OTHER): Payer: BC Managed Care – PPO | Admitting: Psychology

## 2021-03-13 DIAGNOSIS — F320 Major depressive disorder, single episode, mild: Secondary | ICD-10-CM

## 2021-03-13 NOTE — Progress Notes (Signed)
Karie Kirks, [facility]  [facility] Counselor/Therapist Progress Note  Patient ID: CHALLEN SPAINHOUR, MRN: [id number],    Date: 03/13/2021  Time Spent: 50 mins  Treatment Type: Individual Therapy  Reported Symptoms: Pt presents for session, via webex video, due to virus outbreak.  Pt shares that he is in his car by himself and grants consent for session.  I shared with pt that I am in my office with no one else present here either.  Mental Status Exam: Appearance:  Casual     Behavior: Appropriate  Motor: Normal  Speech/Language:  Clear and Coherent  Affect: Appropriate  Mood: normal  Thought process: normal  Thought content:   WNL  Sensory/Perceptual disturbances:   WNL  Orientation: oriented to person, place, and time/date  Attention: Good  Concentration: Good  Memory: WNL  Fund of knowledge:  Good  Insight:   Good  Judgment:  Good  Impulse Control: Good   Risk Assessment: Danger to Self:  No Self-injurious Behavior: No Danger to Others: No Duty to Warn:no Physical Aggression / Violence:No  Access to Firearms a concern: No  Gang Involvement:No   Subjective: Pt shares that he did complete his proposal at work and felt that it went well for him.  Pt shares that he and [name] have planned [place] and they are excited about going in June.  They have paid for the trip with the credit card and includes their flights as well.  Pt also shares that he has gotten his invitation to the wedding that he sent to [place] back, "Returned to sender".  He recognizes this as her choice and he feels good that it was her choice and not his.  Pt did share with his mom that he sent the invitation and that he had received the invitation back in the mail.  Pt feels good about having sent the invitation.  Pt continues to take his Vyvanse regularly and feels it is being effective for him.  Pt shares again about his intention to  eventually wean off of the Zoloft and Wellbutrin at some point.  He would like to come off of the Zoloft safely to see how the Wellbutrin by itself would work for pt.  Work continues to be busy but is still very manageable for pt.  Pt shares that he has gotten a promotion to [employer]" as opposed to the previous "Social worker.  Pt appreciates the recognition from his boss.  [name] was also proud of pt for his promotion as well.  Pt shares that he "splurged" for a new game system since his promotion and raise and he enjoys playing those old and new games.  Pt also engages in walking the dog in the evening as well.  They are working on some ceremony decisions as well as [employer], but most of the bigger decisions have been made at this point.  Talked more with pt about his difficulty accepting compliments; he was able to appreciate his boss' perspective of reasons for conveying the promotion and raise to him.  We can continue to refer to [name]'s email regarding concerns she has expressed to pt over their relationship and working towards resolving some of those in future sessions.  Encouraged pt to continue with his self care activities and we will meet in 2 wks for a follow up session.  Interventions: Cognitive Behavioral Therapy  Diagnosis:Current mild episode of major depressive disorder without prior episode [facility])  Plan:  Treatment Plan Strengths/Abilities:  Intelligent, Intuitive, Willing to participate in therapy Treatment Preferences:  Outpatient Individual Therapy Statement of Needs:  Patient is to use CBT, mindfulness and coping skills to help manage and/or decrease symptoms associated with their diagnosis. Symptoms:  Depressed/Irritable mood, worry, social withdrawal Problems Addressed:  Depressive thoughts, Sadness, Sleep issues, etc. Long Term Goals:  Pt to reduce overall level, frequency, and intensity of the feelings of depression as evidenced by decreased irritability,  negative self talk, and helpless feelings from 6 to 7 days/week to 0 to 1 days/week, per client report, for at least 3 consecutive months.  Progress: 20% Short Term Goals:  Pt to verbally express understanding of the relationship between feelings of depression and their impact on thinking patterns and behaviors.  Pt to verbalize an understanding of the role that distorted thinking plays in creating fears, excessive worry, and ruminations.  Progress: 20% Target Date:  01/30/2022 Frequency:  Bi-weekly Modality:  Cognitive Behavioral Therapy Interventions by Therapist:  Therapist will use CBT, Mindfulness exercises, Coping skills and Referrals, as needed by client. Client has verbally approved this treatment plan. Karie Kirks, [facility]

## 2021-03-20 ENCOUNTER — Ambulatory Visit: Payer: BC Managed Care – PPO | Admitting: Psychology

## 2021-03-27 ENCOUNTER — Ambulatory Visit (INDEPENDENT_AMBULATORY_CARE_PROVIDER_SITE_OTHER): Payer: BC Managed Care – PPO | Admitting: Psychology

## 2021-03-27 ENCOUNTER — Ambulatory Visit (INDEPENDENT_AMBULATORY_CARE_PROVIDER_SITE_OTHER): Payer: BC Managed Care – PPO | Admitting: Internal Medicine

## 2021-03-27 ENCOUNTER — Other Ambulatory Visit: Payer: Self-pay

## 2021-03-27 ENCOUNTER — Encounter: Payer: Self-pay | Admitting: Internal Medicine

## 2021-03-27 VITALS — BP 116/78 | HR 81 | Ht 71.0 in | Wt 203.0 lb

## 2021-03-27 DIAGNOSIS — I77810 Thoracic aortic ectasia: Secondary | ICD-10-CM

## 2021-03-27 DIAGNOSIS — F320 Major depressive disorder, single episode, mild: Secondary | ICD-10-CM

## 2021-03-27 LAB — BASIC METABOLIC PANEL
BUN/Creatinine Ratio: 16 (ref 9–20)
BUN: 14 mg/dL (ref 6–20)
CO2: 24 mmol/L (ref 20–29)
Calcium: 9.2 mg/dL (ref 8.7–10.2)
Chloride: 104 mmol/L (ref 96–106)
Creatinine, Ser: 0.88 mg/dL (ref 0.76–1.27)
Glucose: 83 mg/dL (ref 70–99)
Potassium: 3.9 mmol/L (ref 3.5–5.2)
Sodium: 142 mmol/L (ref 134–144)
eGFR: 117 mL/min/{1.73_m2} (ref >59)

## 2021-03-27 NOTE — Patient Instructions (Signed)
Medication Instructions:  No Changes In Medications at this time.  *If you need a refill on your cardiac medications before your next appointment, please call your pharmacy*  Lab Work: BMET- TODAY  If you have labs (blood work) drawn today and your tests are completely normal, you will receive your results only by: MyChart Message (if you have MyChart) OR A paper copy in the mail If you have any lab test that is abnormal or we need to change your treatment, we will call you to review the results.  Testing/Procedures: CTA CHEST/AORTA -SOMEONE WILL REACH OUT TO YOU TO GET THIS SCHEDULED   Follow-Up: At [facility], you and your health needs are our priority.  As part of our continuing mission to provide you with exceptional heart care, we have created designated Provider Care Teams.  These Care Teams include your primary Cardiologist (physician) and Advanced Practice Providers (APPs -  Physician Assistants and Nurse Practitioners) who all work together to provide you with the care you need, when you need it.  Your next appointment:   6 month(s)  The format for your next appointment:   In Person  Provider:   Maisie Fus, MD

## 2021-03-27 NOTE — Progress Notes (Signed)
Cardiology Office Note:    Date:  03/27/2021   ID:  ALOK MINSHALL, DOB Aug 09, 1990, MRN [id number]  PCP:  Darrow Bussing, MD   [facility] Providers Cardiologist:  None     Referring MD: Darrow Bussing, MD   No chief complaint on file.  Hx of SVT  History of Present Illness:   Initial HPI  Harry Anderson is a 31 y.o. male with a hx of anxiety, ADHD  Reported syncopal episode in 2012. He woke up with bowel pain and sudden urge to defecate and noted palpitations and hot flash. He then reported passing out and loosing consciousness. He had a holter monitor read has baseline rhythm as sinus, with sinus bradycardia as well as episode of SVT. He was prescribed metoprolol XL 25 mg daily. He was told to stop it in  [place] in 2012 and it was stopped. He saw a cardiologist in 2015-2016 in [place]. He had a repeat 4 week monitor. Noted runs of sinus tachycardia in the evening 08/2014. Rates up to 157 noted as sinus tachycardia. 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 He had a bubble study in [place]. No records of his echocardiogram.   He is on adderral. He has increased heart rates since recently starting.  His metoprolol was increased to 25 mg XL. When he does not take it  he feels palpitations. He denies heart racing with LH, dizziness, no repeat syncope.  He drinks 2-3x per week. He drinks 2 servings of caffeine per day. He's a teacher  Holter 2012- 24 runs of SVT. Max rate 115 bpm. Longets run was 24 beats.  Family Hx: His mother had an aortic valve and root replacement. She was diagnosed with afib. S/p ppm. His sister has POTs and Ehrlers danlos. No hx of SCD in 1st degree relatives   12/15/2020 Crt 0.93 K 4.3 Hgb 15 LDL 141 (age too young to assess risk with pooled cohort equation; not in familial hyperlipidemia range) TC 215  TSH 1.27  Interim hx: He started low dose  of metoprolol. He does not notice a huge difference but feels more comfortable continuing the medication with an increase in his vyvanse to 50 mg daily  Past Medical History:  Diagnosis Date   Anxiety    SVT (supraventricular tachycardia) (HCC)     Past Surgical History:  Procedure Laterality Date   Wisdom teeth removal     WISDOM TOOTH EXTRACTION      Current Medications: No outpatient medications have been marked as taking for the 03/27/21 encounter (Appointment) with Maisie Fus, MD.     Allergies:   Codeine sulfate   Social History   Socioeconomic History   Marital status: Single    Spouse name: Not on file   Number of children: Not on file   Years of education: Not on file   Highest education level: Not on file  Occupational History   Not on file  Tobacco Use   Smoking status: Never   Smokeless tobacco: Not on file  Substance and Sexual Activity   Alcohol use: No   Drug use: Not on file   Sexual activity: Never  Other Topics Concern   Not on file  Social History Narrative   Not on file   Social Determinants of Health   Financial Resource Strain: Not on file  Food Insecurity: Not on file  Transportation Needs: Not on file  Physical Activity: Not on file  Stress: Not on file  Social Connections: Not on file     Family History: The patient's family history includes Atrial fibrillation in his mother; Breast cancer in an other family member; Colon cancer in an other family member; Ehlers-Danlos syndrome in his sister; Heart attack in his maternal grandfather.  ROS:   Please see the history of present illness.     All other systems reviewed and are negative.  EKGs/Labs/Other Studies Reviewed:    The following studies were reviewed today:   EKG:  EKG is  ordered today.  The ekg ordered today demonstrates   NSR, no pre-excitation, Qtc 433 ms  NSR, septal q lead placement?  Recent Labs: No results found for requested labs within last 8760 hours.   Recent Lipid Panel No results found for: CHOL, TRIG, HDL, CHOLHDL, VLDL, LDLCALC, LDLDIRECT   Risk Assessment/Calculations:           Physical Exam:    VS:  Vitals:   03/27/21 0826  BP: 116/78  Pulse: 81       Wt Readings from Last 3 Encounters:  12/19/20 209 lb 3.2 oz (94.9 kg)  02/01/10 142 lb (64.4 kg)     GEN:  Well nourished, well developed in no acute distress HEENT: moist mucous membranes LYMPHATICS: No lymphadenopathy CARDIAC: RRR, no murmurs, rubs, gallops RESPIRATORY:  Clear to auscultation without rales, wheezing or rhonchi  ABDOMEN: non-distended MUSCULOSKELETAL:  No edema; No deformity  SKIN: Warm and dry NEUROLOGIC:  Alert and oriented x 3 PSYCHIATRIC:  Normal affect   ASSESSMENT:    #SVT: Previously he noted episode of SVT on holter monitor 10 years ago. He had a brief run up to 24 beats and max rate only 115 bpm. He's had no recent syncopal episode. The episode in 2012 is consistent with vasovagal syncope. He noted after stopping metoprolol he notes some notable HR. I had him repeat cardiac monitor which showed short runs of SVT, NSVT. Low concern for reentry tachycardia or ischemia. We discussed he can  continue his metoprolol while on higher dose of adderrall.  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 He has family hx of aortopathy, will plan for CT angio to get more specific measurements. If indeed 40 mm will plan for surveillance.   PLAN:    In order of problems listed above:  CTA Aorta No medication changes Follow up in 6 months      Medication Adjustments/Labs and Tests Ordered: Current medicines are reviewed at length with the patient today.  Concerns regarding medicines are outlined above.    Signed, Maisie Fus, MD  03/27/2021 8:21 AM    [facility]

## 2021-03-27 NOTE — Progress Notes (Signed)
Karie Kirks, [facility]  [facility] Counselor/Therapist Progress Note  Patient ID: SWANSON FARNELL, MRN: [id number],    Date: 03/27/2021  Time Spent: 50 mins  Treatment Type: Individual Therapy  Reported Symptoms: Pt presents for session, via webex video, due to virus outbreak.  Pt shares that he is in his home by himself and grants consent for session.  I shared with pt that I am in my office with no one else present here either.  Mental Status Exam: Appearance:  Casual     Behavior: Appropriate  Motor: Normal  Speech/Language:  Clear and Coherent  Affect: Appropriate  Mood: normal  Thought process: normal  Thought content:   WNL  Sensory/Perceptual disturbances:   WNL  Orientation: oriented to person, place, and time/date  Attention: Good  Concentration: Good  Memory: WNL  Fund of knowledge:  Good  Insight:   Good  Judgment:  Good  Impulse Control: Good   Risk Assessment: Danger to Self:  No Self-injurious Behavior: No Danger to Others: No Duty to Warn:no Physical Aggression / Violence:No  Access to Firearms a concern: No  Gang Involvement:No   Subjective: Pt shares that he has been fine; "nothing great or terrible in the past 2 wks.  I have not felt great in the past week; [name] was sick a week before then."  Pt shares that they are doing fine with wedding planning; "most of the big ticket items are out of the way.  [name] is doing a lot of the DIY stuff to prepare for the wedding.  I am trying to be as helpful as I can be."  She continues to work 30 hrs per week (she takes Mondays off and ends earlier on Friday).  Pt shares that his job is "going pretty well.  I made the mistake of telling coworkers that I had some time in my schedule that I could help them, if needed.  I have gotten a few new things from them and that feels good."  Pt shares his boss has continued to be supportive of him.  He is going to the office about 2 days  per week.  Pt shares that his dad recently had a medical issue flare up but is doing OK.  Pt is also helping his dad with new computer equipment.  He has also asked his mom to be more engaged with reaching out to pt and [name] and she is not great at doing so.  Pt shares that he wants his mom to reach out to him as a means of pt feeling more valued by her (also happens with friends in his life).  Talked about keeping score is generally not helpful for [place]; can cause [place] to lose opportunities.  Asked pt to think about what he gets out of these relationships that he feels like he has to work harder to maintain and is that enough to keep him engaged (hopefully it is enough).  Pt shares they have continued to play trivia with friends, walking the dog, would like to ride the bike more frequently.  Pt shares that he is pretty strongly satisfied with his life at this time; he and [name] continue to see the marriage therapist and feels that is going well.  [name] is not crazy about the therapist, due to their lack of knowledge and experience.  Pt continues to take his Zoloft and Wellbutrin; he still  would like to come off of the Zoloft after the honeymoon and see if he can still maintain his mood.  Encouraged pt to continue with his self care activities and we will meet in 2 wks for a follow up session.  Interventions: Cognitive Behavioral Therapy  Diagnosis:Current mild episode of major depressive disorder without prior episode [facility])  Plan:  Treatment Plan Strengths/Abilities:  Intelligent, Intuitive, Willing to participate in therapy Treatment Preferences:  Outpatient Individual Therapy Statement of Needs:  Patient is to use CBT, mindfulness and coping skills to help manage and/or decrease symptoms associated with their diagnosis. Symptoms:  Depressed/Irritable mood, worry, social withdrawal Problems Addressed:  Depressive thoughts, Sadness, Sleep issues, etc. Long Term Goals:  Pt to reduce overall level,  frequency, and intensity of the feelings of depression as evidenced by decreased irritability, negative self talk, and helpless feelings from 6 to 7 days/week to 0 to 1 days/week, per client report, for at least 3 consecutive months.  Progress: 20% Short Term Goals:  Pt to verbally express understanding of the relationship between feelings of depression and their impact on thinking patterns and behaviors.  Pt to verbalize an understanding of the role that distorted thinking plays in creating fears, excessive worry, and ruminations.  Progress: 20% Target Date:  01/30/2022 Frequency:  Bi-weekly Modality:  Cognitive Behavioral Therapy Interventions by Therapist:  Therapist will use CBT, Mindfulness exercises, Coping skills and Referrals, as needed by client. Client has verbally approved this treatment plan. Karie Kirks, [facility]               Karie Kirks, [facility]

## 2021-04-03 ENCOUNTER — Ambulatory Visit: Payer: BC Managed Care – PPO | Admitting: Psychology

## 2021-04-10 ENCOUNTER — Ambulatory Visit: Payer: BC Managed Care – PPO | Admitting: Psychology

## 2021-04-16 ENCOUNTER — Ambulatory Visit (HOSPITAL_COMMUNITY)
Admission: RE | Admit: 2021-04-16 | Discharge: 2021-04-16 | Disposition: A | Discharge status: Home / Self Care | Payer: BC Managed Care – PPO | Source: Ambulatory Visit | Attending: Internal Medicine | Admitting: Internal Medicine

## 2021-04-16 ENCOUNTER — Other Ambulatory Visit: Payer: Self-pay

## 2021-04-16 DIAGNOSIS — I77810 Thoracic aortic ectasia: Secondary | ICD-10-CM | POA: Insufficient documentation

## 2021-04-16 MED ORDER — IOHEXOL 350 MG/ML SOLN
100.0000 mL | Freq: Once | INTRAVENOUS | Status: AC | PRN
  Administered 2021-04-16: 100 mL via INTRAVENOUS

## 2021-04-17 ENCOUNTER — Ambulatory Visit: Payer: BC Managed Care – PPO | Admitting: Psychology

## 2021-04-24 ENCOUNTER — Ambulatory Visit: Payer: BC Managed Care – PPO | Admitting: Psychology

## 2021-04-24 DIAGNOSIS — F320 Major depressive disorder, single episode, mild: Secondary | ICD-10-CM
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
## 2021-05-01 DIAGNOSIS — Z79899 Other long term (current) drug therapy: Secondary | ICD-10-CM | POA: Diagnosis not present

## 2021-05-01 DIAGNOSIS — F902 Attention-deficit hyperactivity disorder, combined type: Secondary | ICD-10-CM | POA: Diagnosis not present

## 2021-05-01 DIAGNOSIS — F419 Anxiety disorder, unspecified: Secondary | ICD-10-CM | POA: Diagnosis not present

## 2021-05-03 DIAGNOSIS — F902 Attention-deficit hyperactivity disorder, combined type: Secondary | ICD-10-CM | POA: Diagnosis not present

## 2021-05-08 ENCOUNTER — Ambulatory Visit (INDEPENDENT_AMBULATORY_CARE_PROVIDER_SITE_OTHER): Payer: BC Managed Care – PPO | Admitting: Psychology

## 2021-05-08 DIAGNOSIS — F320 Major depressive disorder, single episode, mild: Secondary | ICD-10-CM
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
## 2021-05-22 ENCOUNTER — Ambulatory Visit (INDEPENDENT_AMBULATORY_CARE_PROVIDER_SITE_OTHER): Payer: BC Managed Care – PPO | Admitting: Psychology

## 2021-05-22 DIAGNOSIS — F320 Major depressive disorder, single episode, mild: Secondary | ICD-10-CM

## 2021-05-22 NOTE — Progress Notes (Signed)
[facility] Counselor/Therapist Progress Note ? ?Patient ID: Harry Anderson, MRN: [id number],   ? ?Date: 05/22/2021 ? ?Time Spent: 45 mins ? ?Treatment Type: Individual Therapy ? ?Reported Symptoms: Pt presents for session, via webex video, due to virus outbreak.  Pt grants consent for the session, stating he is in his home with no one else present.  I shared with pt that I am in my office with no one else present here.    ? ?Mental Status Exam: ?Appearance:  Casual     ?Behavior: Appropriate  ?Motor: Normal  ?Speech/Language:  Clear and Coherent  ?Affect: Appropriate  ?Mood: normal  ?Thought process: normal  ?Thought content:   WNL  ?Sensory/Perceptual disturbances:   WNL  ?Orientation: oriented to person, place, and time/date  ?Attention: Good  ?Concentration: Good  ?Memory: WNL  ?Fund of knowledge:  Good  ?Insight:   Good  ?Judgment:  Good  ?Impulse Control: Good  ? ?Risk Assessment: ?Danger to Self:  No ?Self-injurious Behavior: No ?Danger to Others: No ?Duty to Warn:no ?Physical Aggression / Violence:No  ?Access to Firearms a concern: No  ?Gang Involvement:No  ? ?Subjective: May 20th is the date of the wedding.  Pt shares "it has been a shitty couple of weeks.  We had plumbing problems that cost [place] about $5000.00 and [name]'s brother died" (he was a half brother and they were estranged; he was 33 yo when [place] was born.  He had an alcohol problem and got angry when he was drinking.  He was 33 yo and was found dead in his home, surrounded by beer cans.).  Pt and [name] went into the home and found him passed away.  [name] is having her bachelorette party in [place] this weekend and pt's bachelor party is in [place] the following weekend.   Pt shares that he has been doing well at work; he did take 3 days off due to the loss of [name]'s half brother.  Their dog is at the vet and pt had to buy new tires for his car.  Pt and [name] asked his parents and [name]'s dad to give the welcome speech at  their wedding and everyone agreed.  Pt is concerned that his parents seem to struggle with their relationship and "they have kind of stopped talking with each other over the past few months."  Pt shares that his parents have long had a superficial relationship.  His dad got connected to his church after his mom died in 04/01/13 and they were helpful to him.  His mom tends to see Raquel Sarna more frequently as her social contact.  He describes his parents as "very introverted but are capable of social interaction."  His father still works he worked as an Therapist, music for [employer].  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  His dad has social interactions at work and at [employer].  Pt shares his mom has had some health issues in the past and has not consistently worked since the 1990s.  She was on disability for a while as well.  She has had multiple surgeries over the years.  She also had a series of therapy through the years and continues to do so now.  Pt does not fault his parents, "but neither of them gets a Mudlogger for good parenting."  Pt shares that he believes his parents gave him a pattern of what does not work so well in a relationship.  He shares that he and [name], normally at her prompting, talk fairly frequently about what works well for them.  Pt shares that his dad grew up outside of [place], [place] and is mom was born in [place], [place]; she was an Geologist, engineering and they met through pt's aunt (father's sister) introduced them in [place].  Pt was born in [place] while they were there.  Pt is still struggling with his ADHD medication not working as well as he wants it to be.  He continues to work with the doctor on a better combination of medication and dosages; he now thinks they are making progress in that search.  Encouraged pt to continue with his self care activities and we will meet in 2 wks for a follow up session.  We will continue talking about family issues at our follow up  session. ? ?Interventions: Cognitive Behavioral Therapy ? ?Diagnosis:Current mild episode of major depressive disorder without prior episode ([place]) ? ?Plan:  Treatment Plan ?Strengths/Abilities:  Intelligent, Intuitive, Willing to participate in therapy ?Treatment Preferences:  Outpatient Individual Therapy ?Statement of Needs:  Patient is to use CBT, mindfulness and coping skills to help manage and/or decrease symptoms associated with their diagnosis. ?Symptoms:  Depressed/Irritable mood, worry, social withdrawal ?Problems Addressed:  Depressive thoughts, Sadness, Sleep issues, etc. ?Long Term Goals:  Pt to reduce overall level, frequency, and intensity of the feelings of depression as evidenced by decreased irritability, negative self talk, and helpless feelings from 6 to 7 days/week to 0 to 1 days/week, per client report, for at least 3 consecutive months.  Progress: 20% ?Short Term Goals:  Pt to verbally express understanding of the relationship between feelings of depression and their impact on thinking patterns and behaviors.  Pt to verbalize an understanding of the role that distorted thinking plays in creating fears, excessive worry, and ruminations.  Progress: 20% ?Target Date:  01/30/2022 ?Frequency:  Bi-weekly ?Modality:  Cognitive Behavioral Therapy ?Interventions by Therapist:  Therapist will use CBT, Mindfulness exercises, Coping skills and Referrals, as needed by client. ?Client has verbally approved this treatment plan. ?Ivan Anchors, [facility] ?

## 2021-06-05 ENCOUNTER — Ambulatory Visit (INDEPENDENT_AMBULATORY_CARE_PROVIDER_SITE_OTHER): Payer: BC Managed Care – PPO | Admitting: Psychology

## 2021-06-05 DIAGNOSIS — F320 Major depressive disorder, single episode, mild: Secondary | ICD-10-CM

## 2021-06-05 NOTE — Progress Notes (Signed)
[facility] Counselor/Therapist Progress Note ? ?Patient ID: Harry Anderson, MRN: [id number],   ? ?Date: 06/05/2021 ? ?Time Spent: 45 mins ? ?Treatment Type: Individual Therapy ? ?Reported Symptoms: Pt presents for session, via webex video, due to virus outbreak.  Pt grants consent for the session, stating he is in his car with no one else present.  I shared with pt that I am in my office with no one else present here.    ? ?Mental Status Exam: ?Appearance:  Casual     ?Behavior: Appropriate  ?Motor: Normal  ?Speech/Language:  Clear and Coherent  ?Affect: Appropriate  ?Mood: normal  ?Thought process: normal  ?Thought content:   WNL  ?Sensory/Perceptual disturbances:   WNL  ?Orientation: oriented to person, place, and time/date  ?Attention: Good  ?Concentration: Good  ?Memory: WNL  ?Fund of knowledge:  Good  ?Insight:   Good  ?Judgment:  Good  ?Impulse Control: Good  ? ?Risk Assessment: ?Danger to Self:  No ?Self-injurious Behavior: No ?Danger to Others: No ?Duty to Warn:no ?Physical Aggression / Violence:No  ?Access to Firearms a concern: No  ?Gang Involvement:No  ? ?Subjective: May 20th is the date of the wedding.  Pt shares "it has been a better couple of weeks.  We still have to pay for a few things but we are both off all week next week so everything should be fine."  They are taking their honeymoon 2 wks later in the [place]; all of those details are in place.  Pt shares that [name] is doing pretty well with the stress of the event that this time.  Pt shares that work is going well recently.  Pt shares that the [employer] and Bachelor parties went well; they both had fun at those events.  Pt shares that their dog is recovering from his foot injury.  Pt shares that he visited this past week with his mom to get them ready for the ceremony.  Pt's parents are coming to the wedding and will be doing their welcome speech to the guests at the wedding.  He wishes his mom would engage more  with him; he would like for her to come over to his house, but she has not told him why she won't come over.  He reiterates that his parents' relationship has been and continues to be dysfunctional and that impacts him.  Pt shares that his mom had gotten active on Twitter and may be using that for social interactions, which pt understands is not enough.  Pt shares he continues to take on most of the chores around the house as [name] has been more focused on wedding needs.  They continue playing trivia weekly with friends and pt enjoys that.  His alcohol use has decreased in the past few months.  [name] has a Chiropodist; pt has not chosen a [employer]  but has two groomsmen.  Pt is hoping for no drama at the wedding, just everyone enjoying the celebration.  Encouraged pt to continue with his self care activities and we will meet in 2 wks for a follow up session.  We will continue talking about family issues at our follow up session. ? ?Interventions: Cognitive Behavioral Therapy ? ?Diagnosis:Current mild episode of major depressive disorder without prior episode (HCC) ? ?Plan:  Treatment Plan ?Strengths/Abilities:  Intelligent, Intuitive, Willing to participate in therapy ?Treatment Preferences:  Outpatient Individual Therapy ?Statement of Needs:  Patient is to use CBT, mindfulness and coping  skills to help manage and/or decrease symptoms associated with their diagnosis. ?Symptoms:  Depressed/Irritable mood, worry, social withdrawal ?Problems Addressed:  Depressive thoughts, Sadness, Sleep issues, etc. ?Long Term Goals:  Pt to reduce overall level, frequency, and intensity of the feelings of depression as evidenced by decreased irritability, negative self talk, and helpless feelings from 6 to 7 days/week to 0 to 1 days/week, per client report, for at least 3 consecutive months.  Progress: 20% ?Short Term Goals:  Pt to verbally express understanding of the relationship between feelings of depression and  their impact on thinking patterns and behaviors.  Pt to verbalize an understanding of the role that distorted thinking plays in creating fears, excessive worry, and ruminations.  Progress: 20% ?Target Date:  01/30/2022 ?Frequency:  Bi-weekly ?Modality:  Cognitive Behavioral Therapy ?Interventions by Therapist:  Therapist will use CBT, Mindfulness exercises, Coping skills and Referrals, as needed by client. ?Client has verbally approved this treatment plan. ?Karie Kirks, [facility] ?

## 2021-06-19 ENCOUNTER — Ambulatory Visit: Payer: BC Managed Care – PPO | Admitting: Psychology

## 2021-06-20 ENCOUNTER — Ambulatory Visit (INDEPENDENT_AMBULATORY_CARE_PROVIDER_SITE_OTHER): Payer: BC Managed Care – PPO | Admitting: Psychology

## 2021-06-20 DIAGNOSIS — F320 Major depressive disorder, single episode, mild: Secondary | ICD-10-CM | POA: Diagnosis not present

## 2021-06-20 NOTE — Progress Notes (Signed)
[place] Counselor/Therapist Progress Note  Patient ID: Harry Anderson, MRN: [id number],    Date: 06/20/2021  Time Spent: 45 mins  Treatment Type: Individual Therapy  Reported Symptoms: Pt presents for session, via webex video, due to virus outbreak.  Pt grants consent for the session, stating he is in his home with no one else present.  I shared with pt that I am in my office with no one else present here.     Mental Status Exam: Appearance:  Casual     Behavior: Appropriate  Motor: Normal  Speech/Language:  Clear and Coherent  Affect: Appropriate  Mood: normal  Thought process: normal  Thought content:   WNL  Sensory/Perceptual disturbances:   WNL  Orientation: oriented to person, place, and time/date  Attention: Good  Concentration: Good  Memory: WNL  Fund of knowledge:  Good  Insight:   Good  Judgment:  Good  Impulse Control: Good   Risk Assessment: Danger to Self:  No Self-injurious Behavior: No Danger to Others: No Duty to Warn:no Physical Aggression / Violence:No  Access to Firearms a concern: No  Gang Involvement:No   Subjective: Pt shares that he and [name] enjoyed the wedding ceremony (5/20) and the reception.  Pt shares that his parents both attended the wedding and did a welcome speech with [name]'s dad.  They had a "day of wedding coordinator" to help them and she was not so helpful.  They are working this week and next and then are going on the honeymoon for a week.  Pt shares that work has been fine this week.  Pt shares that, due to the requirements of the wedding, he has not been able to get into self care activities very much; he hopes to reengage with them soon.  Pt shares that her mom wanted someone to drive her too and from the rehearsal/dinner and the ceremony; she did not want to ride to the events with pt's father.  She describes pt's father as being withdrawn and emotionally unavailable.  Pt shares he has made attempts in the past to  help his parents reconcile and those attempts have not helped the situation so he is not likely to try again.  Pt shares he wants to wait until after his honeymoon to reschedule future appts.  Interventions: Cognitive Behavioral Therapy  Diagnosis:Current mild episode of major depressive disorder without prior episode [facility])  Plan:  Treatment Plan Strengths/Abilities:  Intelligent, Intuitive, Willing to participate in therapy Treatment Preferences:  Outpatient Individual Therapy Statement of Needs:  Patient is to use CBT, mindfulness and coping skills to help manage and/or decrease symptoms associated with their diagnosis. Symptoms:  Depressed/Irritable mood, worry, social withdrawal Problems Addressed:  Depressive thoughts, Sadness, Sleep issues, etc. Long Term Goals:  Pt to reduce overall level, frequency, and intensity of the feelings of depression as evidenced by decreased irritability, negative self talk, and helpless feelings from 6 to 7 days/week to 0 to 1 days/week, per client report, for at least 3 consecutive months.  Progress: 20% Short Term Goals:  Pt to verbally express understanding of the relationship between feelings of depression and their impact on thinking patterns and behaviors.  Pt to verbalize an understanding of the role that distorted thinking plays in creating fears, excessive worry, and ruminations.  Progress: 20% Target Date:  01/30/2022 Frequency:  Bi-weekly Modality:  Cognitive Behavioral Therapy Interventions by Therapist:  Therapist will use CBT, Mindfulness exercises, Coping skills and Referrals, as needed by client. Client has verbally  approved this treatment plan. Ivan Anchors, [facility]

## 2021-07-03 ENCOUNTER — Ambulatory Visit: Payer: BC Managed Care – PPO | Admitting: Psychology

## 2021-07-17 ENCOUNTER — Ambulatory Visit: Payer: BC Managed Care – PPO | Admitting: Psychology

## 2021-07-25 DIAGNOSIS — J3081 Allergic rhinitis due to animal (cat) (dog) hair and dander: Secondary | ICD-10-CM | POA: Diagnosis not present

## 2021-07-25 DIAGNOSIS — J301 Allergic rhinitis due to pollen: Secondary | ICD-10-CM | POA: Diagnosis not present

## 2021-07-25 DIAGNOSIS — H1045 Other chronic allergic conjunctivitis: Secondary | ICD-10-CM | POA: Diagnosis not present

## 2021-07-25 DIAGNOSIS — J3089 Other allergic rhinitis: Secondary | ICD-10-CM | POA: Diagnosis not present

## 2021-08-03 DIAGNOSIS — Z79899 Other long term (current) drug therapy: Secondary | ICD-10-CM | POA: Diagnosis not present

## 2021-08-03 DIAGNOSIS — F419 Anxiety disorder, unspecified: Secondary | ICD-10-CM | POA: Diagnosis not present

## 2021-08-03 DIAGNOSIS — F902 Attention-deficit hyperactivity disorder, combined type: Secondary | ICD-10-CM | POA: Diagnosis not present

## 2021-08-08 DIAGNOSIS — F419 Anxiety disorder, unspecified: Secondary | ICD-10-CM | POA: Diagnosis not present

## 2021-08-08 DIAGNOSIS — F321 Major depressive disorder, single episode, moderate: Secondary | ICD-10-CM | POA: Diagnosis not present

## 2021-08-08 DIAGNOSIS — Z79899 Other long term (current) drug therapy: Secondary | ICD-10-CM | POA: Diagnosis not present

## 2021-08-14 DIAGNOSIS — F411 Generalized anxiety disorder: Secondary | ICD-10-CM | POA: Diagnosis not present

## 2021-08-28 DIAGNOSIS — Z131 Encounter for screening for diabetes mellitus: Secondary | ICD-10-CM | POA: Diagnosis not present

## 2021-08-28 DIAGNOSIS — F321 Major depressive disorder, single episode, moderate: Secondary | ICD-10-CM | POA: Diagnosis not present

## 2021-08-28 DIAGNOSIS — Z23 Encounter for immunization: Secondary | ICD-10-CM | POA: Diagnosis not present

## 2021-08-28 DIAGNOSIS — F419 Anxiety disorder, unspecified: Secondary | ICD-10-CM | POA: Diagnosis not present

## 2021-08-28 DIAGNOSIS — G4733 Obstructive sleep apnea (adult) (pediatric): Secondary | ICD-10-CM | POA: Diagnosis not present

## 2021-08-28 DIAGNOSIS — Z1329 Encounter for screening for other suspected endocrine disorder: Secondary | ICD-10-CM | POA: Diagnosis not present

## 2021-08-28 DIAGNOSIS — E7800 Pure hypercholesterolemia, unspecified: Secondary | ICD-10-CM | POA: Diagnosis not present

## 2021-08-28 DIAGNOSIS — Z0000 Encounter for general adult medical examination without abnormal findings: Secondary | ICD-10-CM | POA: Diagnosis not present

## 2021-08-29 DIAGNOSIS — F411 Generalized anxiety disorder: Secondary | ICD-10-CM | POA: Diagnosis not present

## 2021-09-05 DIAGNOSIS — F411 Generalized anxiety disorder: Secondary | ICD-10-CM | POA: Diagnosis not present

## 2021-09-13 DIAGNOSIS — F411 Generalized anxiety disorder: Secondary | ICD-10-CM | POA: Diagnosis not present

## 2021-09-20 DIAGNOSIS — F411 Generalized anxiety disorder: Secondary | ICD-10-CM | POA: Diagnosis not present

## 2021-09-26 DIAGNOSIS — L814 Other melanin hyperpigmentation: Secondary | ICD-10-CM | POA: Diagnosis not present

## 2021-09-26 DIAGNOSIS — D225 Melanocytic nevi of trunk: Secondary | ICD-10-CM | POA: Diagnosis not present

## 2021-09-26 DIAGNOSIS — D2271 Melanocytic nevi of right lower limb, including hip: Secondary | ICD-10-CM | POA: Diagnosis not present

## 2021-09-26 DIAGNOSIS — D485 Neoplasm of uncertain behavior of skin: Secondary | ICD-10-CM | POA: Diagnosis not present

## 2021-09-26 DIAGNOSIS — L821 Other seborrheic keratosis: Secondary | ICD-10-CM | POA: Diagnosis not present

## 2021-09-26 DIAGNOSIS — D224 Melanocytic nevi of scalp and neck: Secondary | ICD-10-CM | POA: Diagnosis not present

## 2021-09-26 DIAGNOSIS — D2261 Melanocytic nevi of right upper limb, including shoulder: Secondary | ICD-10-CM | POA: Diagnosis not present

## 2021-09-27 DIAGNOSIS — F411 Generalized anxiety disorder: Secondary | ICD-10-CM | POA: Diagnosis not present

## 2021-10-04 DIAGNOSIS — F411 Generalized anxiety disorder: Secondary | ICD-10-CM | POA: Diagnosis not present

## 2021-10-08 DIAGNOSIS — G4719 Other hypersomnia: Secondary | ICD-10-CM | POA: Diagnosis not present

## 2021-10-08 DIAGNOSIS — G4733 Obstructive sleep apnea (adult) (pediatric): Secondary | ICD-10-CM | POA: Diagnosis not present

## 2021-10-11 DIAGNOSIS — F411 Generalized anxiety disorder: Secondary | ICD-10-CM | POA: Diagnosis not present

## 2021-10-18 DIAGNOSIS — F411 Generalized anxiety disorder: Secondary | ICD-10-CM | POA: Diagnosis not present

## 2021-11-19 DIAGNOSIS — R0683 Snoring: Secondary | ICD-10-CM | POA: Diagnosis not present

## 2021-11-23 DIAGNOSIS — L237 Allergic contact dermatitis due to plants, except food: Secondary | ICD-10-CM | POA: Diagnosis not present

## 2021-11-26 DIAGNOSIS — F902 Attention-deficit hyperactivity disorder, combined type: Secondary | ICD-10-CM | POA: Diagnosis not present

## 2021-11-26 DIAGNOSIS — Z79899 Other long term (current) drug therapy: Secondary | ICD-10-CM | POA: Diagnosis not present

## 2021-11-26 DIAGNOSIS — F419 Anxiety disorder, unspecified: Secondary | ICD-10-CM | POA: Diagnosis not present

## 2021-11-28 DIAGNOSIS — Z79899 Other long term (current) drug therapy: Secondary | ICD-10-CM | POA: Diagnosis not present

## 2021-11-29 DIAGNOSIS — G47419 Narcolepsy without cataplexy: Secondary | ICD-10-CM | POA: Diagnosis not present

## 2021-11-29 DIAGNOSIS — F411 Generalized anxiety disorder: Secondary | ICD-10-CM | POA: Diagnosis not present

## 2021-12-06 DIAGNOSIS — F411 Generalized anxiety disorder: Secondary | ICD-10-CM | POA: Diagnosis not present

## 2021-12-13 DIAGNOSIS — G47419 Narcolepsy without cataplexy: Secondary | ICD-10-CM | POA: Diagnosis not present

## 2021-12-13 DIAGNOSIS — F411 Generalized anxiety disorder: Secondary | ICD-10-CM | POA: Diagnosis not present

## 2021-12-24 ENCOUNTER — Ambulatory Visit: Payer: BC Managed Care – PPO | Attending: Internal Medicine | Admitting: Internal Medicine

## 2021-12-24 ENCOUNTER — Encounter: Payer: Self-pay | Admitting: Internal Medicine

## 2021-12-24 VITALS — BP 110/60 | HR 61 | Ht 71.0 in | Wt 203.0 lb

## 2021-12-24 DIAGNOSIS — R002 Palpitations: Secondary | ICD-10-CM

## 2021-12-24 NOTE — Patient Instructions (Signed)

## 2021-12-24 NOTE — Progress Notes (Signed)
Cardiology Office Note:    Date:  12/24/2021   ID:  Harry Anderson, DOB 04-Jan-1991, MRN [id number]  PCP:  Lujean Amel, MD   [facility] Providers Cardiologist:  Janina Mayo, MD     Referring MD: Lujean Amel, MD   No chief complaint on file.  Hx of SVT  History of Present Illness:   Initial HPI  Harry Anderson is a 31 y.o. male with a hx of anxiety, ADHD  Reported syncopal episode in 2012. He woke up with bowel pain and sudden urge to defecate and noted palpitations and hot flash. He then reported passing out and loosing consciousness. He had a holter monitor read has baseline rhythm as sinus, with sinus bradycardia as well as episode of SVT. He was prescribed metoprolol XL 25 mg daily. He was told to stop it in  [place] in 2012 and it was stopped. He saw a cardiologist in 2015-2016 in [place]. He had a repeat 4 week monitor. Noted runs of sinus tachycardia in the evening 08/2014. Rates up to 157 noted as sinus tachycardia. 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 He had a bubble study in [place]. No records of his echocardiogram.   He is on adderral. He has increased heart rates since recently starting.  His metoprolol was increased to 25 mg XL. When he does not take it  he feels palpitations. He denies heart racing with LH, dizziness, no repeat syncope.  He drinks 2-3x per week. He drinks 2 servings of caffeine per day. He's a teacher  Holter 2012- 24 runs of SVT. Max rate 115 bpm. Longets run was 24 beats.  Family Hx: His mother had an aortic valve and root replacement. She was diagnosed with afib. S/p ppm. His sister has POTs and Ehrlers danlos. No hx of SCD in 1st degree relatives   12/15/2020 Crt 0.93 K 4.3 Hgb 15 LDL 141 (age too young to assess risk with pooled cohort equation; not in familial hyperlipidemia range) TC 215  TSH 1.27  Interim hx: He  started low dose of metoprolol. He does not notice a huge difference but feels more comfortable continuing the medication with an increase in his vyvanse to 50 mg daily  Interim hx 11/27 He's doing well with the 25 mg XL metoprolol. Continues on Vyvanse  Past Medical History:  Diagnosis Date   Anxiety    SVT (supraventricular tachycardia) (HCC)     Past Surgical History:  Procedure Laterality Date   Wisdom teeth removal     WISDOM TOOTH EXTRACTION      Current Medications: No outpatient medications have been marked as taking for the 12/24/21 encounter (Appointment) with Janina Mayo, MD.     Allergies:   Codeine sulfate   Social History   Socioeconomic History   Marital status: Single    Spouse name: Not on file   Number of children: Not on file   Years of education: Not on file   Highest education level: Not on file  Occupational History   Not on file  Tobacco Use   Smoking status: Never   Smokeless tobacco: Not on file  Substance and Sexual Activity   Alcohol use: No   Drug use: Not on file   Sexual activity: Never  Other Topics Concern   Not on file  Social History Narrative   Not on file  Social Determinants of Health   Financial Resource Strain: Not on file  Food Insecurity: Not on file  Transportation Needs: Not on file  Physical Activity: Not on file  Stress: Not on file  Social Connections: Not on file     Family History: The patient's family history includes Atrial fibrillation in his mother; Breast cancer in an other family member; Colon cancer in an other family member; Ehlers-Danlos syndrome in his sister; Heart attack in his maternal grandfather.  ROS:   Please see the history of present illness.     All other systems reviewed and are negative.  EKGs/Labs/Other Studies Reviewed:    The following studies were reviewed today:   EKG:  EKG is  ordered today.  The ekg ordered today demonstrates   NSR, no pre-excitation, Qtc 433  ms  NSR, septal q lead placement?  Recent Labs: 03/27/2021: BUN 14; Creatinine, Ser 0.88; Potassium 3.9; Sodium 142   Recent Lipid Panel No results found for: "CHOL", "TRIG", "HDL", "CHOLHDL", "VLDL", "LDLCALC", "LDLDIRECT"   Risk Assessment/Calculations:           Physical Exam:    VS:  Vitals:   12/24/21 1606  BP: 110/60  Pulse: 61  SpO2: 96%     Wt Readings from Last 3 Encounters:  03/27/21 203 lb (92.1 kg)  12/19/20 209 lb 3.2 oz (94.9 kg)  02/01/10 142 lb (64.4 kg)     GEN:  Well nourished, well developed in no acute distress HEENT: moist mucous membranes LYMPHATICS: No lymphadenopathy CARDIAC: RRR, no murmurs, rubs, gallops RESPIRATORY:  Clear to auscultation without rales, wheezing or rhonchi  ABDOMEN: non-distended MUSCULOSKELETAL:  No edema; No deformity  SKIN: Warm and dry NEUROLOGIC:  Alert and oriented x 3 PSYCHIATRIC:  Normal affect   ASSESSMENT:    #SVT: Previously he noted episode of SVT on holter monitor 10 years ago. He had a brief run up to 24 beats and max rate only 115 bpm. He's had no recent syncopal episode. The episode in 2012 is consistent with vasovagal syncope. He noted after stopping metoprolol he notes some notable HR. I had him repeat cardiac monitor which showed short runs of SVT. Can trial off BB in the future , mainly for symptom relief. 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 He has family hx of aortopathy. CTA showed sinus of valsalva 37 mm; benign.  PLAN:    In order of problems listed above:   Follow up as needed      Medication Adjustments/Labs and Tests Ordered: Current medicines are reviewed at length with the patient today.  Concerns regarding medicines are outlined above.    Signed, Maisie Fus, MD  12/24/2021 3:58 PM    [facility]

## 2021-12-27 DIAGNOSIS — F411 Generalized anxiety disorder: Secondary | ICD-10-CM | POA: Diagnosis not present

## 2022-01-03 DIAGNOSIS — F411 Generalized anxiety disorder: Secondary | ICD-10-CM | POA: Diagnosis not present

## 2022-01-17 DIAGNOSIS — F411 Generalized anxiety disorder: Secondary | ICD-10-CM | POA: Diagnosis not present

## 2022-01-31 DIAGNOSIS — F411 Generalized anxiety disorder: Secondary | ICD-10-CM | POA: Diagnosis not present

## 2022-02-07 DIAGNOSIS — F411 Generalized anxiety disorder: Secondary | ICD-10-CM | POA: Diagnosis not present

## 2022-02-14 DIAGNOSIS — F411 Generalized anxiety disorder: Secondary | ICD-10-CM | POA: Diagnosis not present

## 2022-02-25 ENCOUNTER — Other Ambulatory Visit (HOSPITAL_COMMUNITY): Payer: Self-pay

## 2022-02-25 DIAGNOSIS — Z79899 Other long term (current) drug therapy: Secondary | ICD-10-CM | POA: Diagnosis not present

## 2022-02-25 DIAGNOSIS — F902 Attention-deficit hyperactivity disorder, combined type: Secondary | ICD-10-CM | POA: Diagnosis not present

## 2022-02-25 DIAGNOSIS — F419 Anxiety disorder, unspecified: Secondary | ICD-10-CM | POA: Diagnosis not present

## 2022-02-25 MED ORDER — LISDEXAMFETAMINE DIMESYLATE 60 MG PO CAPS
60.0000 mg | ORAL_CAPSULE | Freq: Every day | ORAL | 0 refills | Status: DC
  Filled 2022-02-25: qty 30, 30d supply, fill #0

## 2022-02-28 DIAGNOSIS — F411 Generalized anxiety disorder: Secondary | ICD-10-CM | POA: Diagnosis not present

## 2022-03-01 ENCOUNTER — Other Ambulatory Visit (HOSPITAL_COMMUNITY): Payer: Self-pay

## 2022-03-01 MED ORDER — METOPROLOL SUCCINATE ER 25 MG PO TB24
25.0000 mg | ORAL_TABLET | Freq: Every day | ORAL | 3 refills | Status: DC
  Filled 2022-03-01 – 2022-04-03 (×2): qty 30, 30d supply, fill #0
  Filled 2022-05-07: qty 30, 30d supply, fill #1
  Filled 2022-07-17: qty 30, 30d supply, fill #2
  Filled 2022-10-17: qty 30, 30d supply, fill #3

## 2022-03-01 MED ORDER — BUPROPION HCL ER (SR) 100 MG PO TB12
100.0000 mg | ORAL_TABLET | Freq: Every morning | ORAL | 3 refills | Status: DC
  Filled 2022-03-01 – 2022-04-03 (×2): qty 30, 30d supply, fill #0

## 2022-03-04 ENCOUNTER — Other Ambulatory Visit (HOSPITAL_COMMUNITY): Payer: Self-pay

## 2022-03-07 DIAGNOSIS — F411 Generalized anxiety disorder: Secondary | ICD-10-CM | POA: Diagnosis not present

## 2022-03-13 ENCOUNTER — Other Ambulatory Visit (HOSPITAL_COMMUNITY): Payer: Self-pay

## 2022-03-14 DIAGNOSIS — F411 Generalized anxiety disorder: Secondary | ICD-10-CM | POA: Diagnosis not present

## 2022-03-28 DIAGNOSIS — F411 Generalized anxiety disorder: Secondary | ICD-10-CM | POA: Diagnosis not present

## 2022-04-03 ENCOUNTER — Other Ambulatory Visit (HOSPITAL_COMMUNITY): Payer: Self-pay

## 2022-04-04 ENCOUNTER — Other Ambulatory Visit (HOSPITAL_COMMUNITY): Payer: Self-pay

## 2022-04-04 DIAGNOSIS — F411 Generalized anxiety disorder: Secondary | ICD-10-CM | POA: Diagnosis not present

## 2022-04-04 MED ORDER — LISDEXAMFETAMINE DIMESYLATE 60 MG PO CAPS
60.0000 mg | ORAL_CAPSULE | Freq: Every day | ORAL | 0 refills | Status: DC
  Filled 2022-04-04: qty 30, 30d supply, fill #0

## 2022-04-08 ENCOUNTER — Other Ambulatory Visit (HOSPITAL_COMMUNITY): Payer: Self-pay

## 2022-04-11 DIAGNOSIS — F411 Generalized anxiety disorder: Secondary | ICD-10-CM | POA: Diagnosis not present

## 2022-04-15 ENCOUNTER — Other Ambulatory Visit (HOSPITAL_COMMUNITY): Payer: Self-pay

## 2022-04-15 DIAGNOSIS — F321 Major depressive disorder, single episode, moderate: Secondary | ICD-10-CM | POA: Diagnosis not present

## 2022-04-15 DIAGNOSIS — F419 Anxiety disorder, unspecified: Secondary | ICD-10-CM | POA: Diagnosis not present

## 2022-04-15 MED ORDER — BUPROPION HCL ER (XL) 150 MG PO TB24
150.0000 mg | ORAL_TABLET | Freq: Every morning | ORAL | 5 refills | Status: DC
  Filled 2022-04-15: qty 30, 30d supply, fill #0
  Filled 2022-05-07: qty 30, 30d supply, fill #1

## 2022-04-18 DIAGNOSIS — F411 Generalized anxiety disorder: Secondary | ICD-10-CM | POA: Diagnosis not present

## 2022-04-25 DIAGNOSIS — F411 Generalized anxiety disorder: Secondary | ICD-10-CM | POA: Diagnosis not present

## 2022-04-30 DIAGNOSIS — N442 Benign cyst of testis: Secondary | ICD-10-CM | POA: Diagnosis not present

## 2022-05-02 DIAGNOSIS — F411 Generalized anxiety disorder: Secondary | ICD-10-CM | POA: Diagnosis not present

## 2022-05-08 ENCOUNTER — Other Ambulatory Visit (HOSPITAL_COMMUNITY): Payer: Self-pay

## 2022-05-08 MED ORDER — LISDEXAMFETAMINE DIMESYLATE 60 MG PO CAPS
60.0000 mg | ORAL_CAPSULE | Freq: Every day | ORAL | 0 refills | Status: DC
  Filled 2022-05-08: qty 30, 30d supply, fill #0

## 2022-05-09 DIAGNOSIS — F411 Generalized anxiety disorder: Secondary | ICD-10-CM | POA: Diagnosis not present

## 2022-05-15 ENCOUNTER — Encounter: Payer: Self-pay | Admitting: Urology

## 2022-05-15 ENCOUNTER — Ambulatory Visit: Payer: BC Managed Care – PPO | Admitting: Urology

## 2022-05-15 ENCOUNTER — Other Ambulatory Visit (HOSPITAL_COMMUNITY): Payer: Self-pay

## 2022-05-15 VITALS — BP 133/85 | HR 98 | Ht 71.0 in | Wt 185.0 lb

## 2022-05-15 DIAGNOSIS — N5082 Scrotal pain: Secondary | ICD-10-CM

## 2022-05-15 DIAGNOSIS — I861 Scrotal varices: Secondary | ICD-10-CM | POA: Diagnosis not present

## 2022-05-15 DIAGNOSIS — N442 Benign cyst of testis: Secondary | ICD-10-CM | POA: Diagnosis not present

## 2022-05-15 MED ORDER — MELOXICAM 7.5 MG PO TABS
7.5000 mg | ORAL_TABLET | Freq: Every day | ORAL | 1 refills | Status: AC
Start: 2022-05-15 — End: ?
  Filled 2022-05-15: qty 10, 10d supply, fill #0

## 2022-05-15 NOTE — Progress Notes (Signed)
Assessment: 1. Scrotal pain, left   2. Testicular cyst   3. Varicocele; left     Plan: I personally reviewed the patient's chart including provider notes, lab and imaging results. I discussed the diagnosis and management of a varicocele. I attempted to reassure the patient that his exam is not concerning for testicular cancer.  I also discussed the typical benign nature of testicular cysts. Recommend further evaluation with a scrotal ultrasound. Meloxicam 7.5 mg daily x 10 days.  May repeat x 1 if necessary. Return to office in 3 weeks.  Chief Complaint:  Chief Complaint  Patient presents with   Testicular cyst    History of Present Illness:  Harry Anderson is a 32 y.o. male who is seen in consultation from Jorge Ny, FNP for evaluation of scrotal discomfort.  He reports onset of symptoms approximately 1 month ago.  He has had some intermittent discomfort in the left scrotum.  He reports this as a 3-4/10.  No scrotal swelling or redness.  No scrotal trauma.  He feels like the left varicocele may be slightly larger.  He has not appreciated any change in the right scrotum.   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  He noted a right sided scrotal nodule on self-exam. He was evaluated with a scrotal ultrasound in June 2021.  This study showed a 4 mm right testicular cyst and multiple calcifications in the left testicle.  He is married with no children.  No known problems with infertility.  He and his wife have not been attempting to conceive.  Past Medical History:  Past Medical History:  Diagnosis Date   Anxiety    SVT (supraventricular tachycardia)     Past Surgical History:  Past Surgical History:  Procedure Laterality Date   Wisdom teeth removal     WISDOM TOOTH EXTRACTION      Allergies:  Allergies  Allergen Reactions   Codeine Sulfate     Family History:  Family History  Problem Relation Age of Onset    Atrial fibrillation Mother    Ehlers-Danlos syndrome Sister    Heart attack Maternal Grandfather    Breast cancer Other    Colon cancer Other     Social History:  Social History   Tobacco Use   Smoking status: Never  Substance Use Topics   Alcohol use: No    Review of symptoms:  Constitutional:  Negative for unexplained weight loss, night sweats, fever, chills ENT:  Negative for nose bleeds, sinus pain, painful swallowing CV:  Negative for chest pain, shortness of breath, exercise intolerance, palpitations, loss of consciousness Resp:  Negative for cough, wheezing, shortness of breath GI:  Negative for nausea, vomiting, diarrhea, bloody stools GU:  Positives noted in HPI; otherwise negative for gross hematuria, dysuria, urinary incontinence Neuro:  Negative for seizures, poor balance, limb weakness, slurred speech Psych:  Negative for lack of energy, depression, anxiety Endocrine:  Negative for polydipsia, polyuria, symptoms of hypoglycemia (dizziness, hunger, sweating) Hematologic:  Negative for anemia, purpura, petechia, prolonged or excessive bleeding, use of anticoagulants  Allergic:  Negative for difficulty breathing or choking as a result of exposure to anything; no shellfish allergy; no allergic response (rash/itch) to materials, foods  Physical exam: BP 133/85   Pulse 98   Ht  (1.803 m)   Wt 185 lb (83.9 kg)   BMI 25.80 kg/m  GENERAL APPEARANCE:  Well appearing, well developed, well nourished, NAD HEENT: Atraumatic, Normocephalic,  oropharynx clear. NECK: Supple without lymphadenopathy or thyromegaly. LUNGS: Clear to auscultation bilaterally. HEART: Regular Rate and Rhythm without murmurs, gallops, or rubs. ABDOMEN: Soft, non-tender, No Masses. EXTREMITIES: Moves all extremities well.  Without clubbing, cyanosis, or edema. NEUROLOGIC:  Alert and oriented x 3, normal gait, CN II-XII grossly intact.  MENTAL STATUS:  Appropriate. BACK:  Non-tender to palpation.   No CVAT SKIN:  Warm, dry and intact.   GU: Penis:  circumcised Meatus: Normal Scrotum: No erythema or edema; left varicocele palpated with minimal tenderness Testis: Small nodule noted on the lateral aspect of the right testicle consistent with a small testicular cyst; no tenderness bilaterally Epididymis: Nontender bilaterally   Results: None

## 2022-05-16 DIAGNOSIS — F411 Generalized anxiety disorder: Secondary | ICD-10-CM | POA: Diagnosis not present

## 2022-05-21 ENCOUNTER — Ambulatory Visit (HOSPITAL_BASED_OUTPATIENT_CLINIC_OR_DEPARTMENT_OTHER)
Admission: RE | Admit: 2022-05-21 | Discharge: 2022-05-21 | Disposition: A | Discharge status: Home / Self Care | Payer: BC Managed Care – PPO | Source: Ambulatory Visit | Attending: Urology | Admitting: Urology

## 2022-05-21 DIAGNOSIS — I861 Scrotal varices: Secondary | ICD-10-CM | POA: Diagnosis not present

## 2022-05-21 DIAGNOSIS — N442 Benign cyst of testis: Secondary | ICD-10-CM | POA: Diagnosis not present

## 2022-05-21 DIAGNOSIS — N5082 Scrotal pain: Secondary | ICD-10-CM | POA: Diagnosis not present

## 2022-05-23 ENCOUNTER — Encounter: Payer: Self-pay | Admitting: Urology

## 2022-05-23 DIAGNOSIS — F411 Generalized anxiety disorder: Secondary | ICD-10-CM | POA: Diagnosis not present

## 2022-05-27 ENCOUNTER — Other Ambulatory Visit (HOSPITAL_COMMUNITY): Payer: Self-pay

## 2022-05-27 ENCOUNTER — Other Ambulatory Visit: Payer: Self-pay

## 2022-05-27 DIAGNOSIS — Z79899 Other long term (current) drug therapy: Secondary | ICD-10-CM | POA: Diagnosis not present

## 2022-05-27 DIAGNOSIS — F902 Attention-deficit hyperactivity disorder, combined type: Secondary | ICD-10-CM | POA: Diagnosis not present

## 2022-05-27 MED ORDER — LISDEXAMFETAMINE DIMESYLATE 60 MG PO CAPS
60.0000 mg | ORAL_CAPSULE | Freq: Every day | ORAL | 0 refills | Status: DC
  Filled 2022-05-27 – 2022-06-10 (×2): qty 30, 30d supply, fill #0

## 2022-05-27 MED ORDER — AMPHETAMINE-DEXTROAMPHETAMINE 15 MG PO TABS
1.0000 | ORAL_TABLET | Freq: Every day | ORAL | 0 refills | Status: DC
  Filled 2022-05-27 – 2022-06-10 (×2): qty 30, 30d supply, fill #0

## 2022-05-28 ENCOUNTER — Other Ambulatory Visit (HOSPITAL_COMMUNITY): Payer: Self-pay

## 2022-05-31 DIAGNOSIS — F411 Generalized anxiety disorder: Secondary | ICD-10-CM | POA: Diagnosis not present

## 2022-06-05 ENCOUNTER — Encounter: Payer: Self-pay | Admitting: Urology

## 2022-06-05 ENCOUNTER — Ambulatory Visit: Payer: BC Managed Care – PPO | Admitting: Urology

## 2022-06-05 VITALS — BP 123/73 | HR 86 | Wt 188.0 lb

## 2022-06-05 DIAGNOSIS — I861 Scrotal varices: Secondary | ICD-10-CM

## 2022-06-05 DIAGNOSIS — N442 Benign cyst of testis: Secondary | ICD-10-CM | POA: Diagnosis not present

## 2022-06-05 DIAGNOSIS — N5082 Scrotal pain: Secondary | ICD-10-CM

## 2022-06-05 DIAGNOSIS — N3943 Post-void dribbling: Secondary | ICD-10-CM

## 2022-06-05 LAB — URINALYSIS, ROUTINE W REFLEX MICROSCOPIC
Bilirubin, UA: NEGATIVE
Glucose, UA: NEGATIVE
Ketones, UA: NEGATIVE
Leukocytes,UA: NEGATIVE
Nitrite, UA: NEGATIVE
RBC, UA: NEGATIVE
Specific Gravity, UA: 1.025 (ref 1.005–1.030)
Urobilinogen, Ur: 0.2 mg/dL (ref 0.2–1.0)
pH, UA: 6.5 (ref 5.0–7.5)

## 2022-06-05 NOTE — Progress Notes (Signed)
Assessment: 1. Scrotal pain, left   2. Testicular cyst; right   3. Varicocele; left   4. Post-void dribbling     Plan: I personally viewed the scrotal ultrasound from 05/21/2022 with results as noted below.  Also noted was a small right testicular cyst unchanged in size from prior study. I discussed these findings with the patient today.  I reassured him that there is no evidence of any serious abnormality involving either testicle.  I discussed possible causes of his left scrotal discomfort. Recommend repeating the course of meloxicam as this seemed to improve his symptoms. Meloxicam 7.5 mg daily x 10 days.   He and his wife are considering attempts at pregnancy later this year.  I advised him to consider evaluation if they have difficulty conceiving after 6 months. Return to office prn  Chief Complaint:  Chief Complaint  Patient presents with   Testicle Pain    History of Present Illness:  Harry Anderson is a 32 y.o. male who is seen for further evaluation of left scrotal discomfort.  At his initial visit in 4/24, he reported onset of symptoms approximately 1 month prior  He had some intermittent discomfort in the left scrotum.  He reported this as a 3-4/10.  No scrotal swelling or redness.  No scrotal trauma.  He felt like the left varicocele may be slightly larger.  He had not appreciated any change in the right scrotum.   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  He noted a right sided scrotal nodule on self-exam. He was evaluated with a scrotal ultrasound in June 2021.  This study showed a 4 mm right testicular cyst and multiple calcifications in the left testicle.  He is married with no children.  No known problems with infertility.  He and his wife have not been attempting to conceive.  He was given a 10-day course of meloxicam for his scrotal discomfort. Scrotal ultrasound from 05/21/2022 showed a small left varicocele and  bilateral testicular microlithiasis.  He was in today for follow-up.  He can needed the 10 days of meloxicam.  He has noted improvement in his left scrotal discomfort.  He reports a decrease in the frequency and severity of the discomfort.  He currently reports that as a 3/10. He also has post-void dribbling.  IPSS = 1 today  Portions of the above documentation were copied from a prior visit for review purposes only.   Past Medical History:  Past Medical History:  Diagnosis Date   Anxiety    SVT (supraventricular tachycardia)     Past Surgical History:  Past Surgical History:  Procedure Laterality Date   Wisdom teeth removal     WISDOM TOOTH EXTRACTION      Allergies:  Allergies  Allergen Reactions   Codeine Sulfate     Family History:  Family History  Problem Relation Age of Onset   Atrial fibrillation Mother    Ehlers-Danlos syndrome Sister    Heart attack Maternal Grandfather    Breast cancer Other    Colon cancer Other     Social History:  Social History   Tobacco Use   Smoking status: Never  Substance Use Topics   Alcohol use: No    ROS: Constitutional:  Negative for fever, chills, weight loss CV: Negative for chest pain, previous MI, hypertension Respiratory:  Negative for shortness of breath, wheezing, sleep apnea, frequent cough GI:  Negative for nausea, vomiting, bloody stool, GERD  Physical  exam: BP 123/73   Pulse 86   Wt 188 lb (85.3 kg)   BMI 26.22 kg/m  GENERAL APPEARANCE:  Well appearing, well developed, well nourished, NAD HEENT:  Atraumatic, normocephalic, oropharynx clear NECK:  Supple without lymphadenopathy or thyromegaly ABDOMEN:  Soft, non-tender, no masses EXTREMITIES:  Moves all extremities well, without clubbing, cyanosis, or edema NEUROLOGIC:  Alert and oriented x 3, normal gait, CN II-XII grossly intact MENTAL STATUS:  appropriate BACK:  Non-tender to palpation, No CVAT SKIN:  Warm, dry, and intact   Results: U/A:   trace protein  PVR:  14 ml

## 2022-06-10 ENCOUNTER — Other Ambulatory Visit: Payer: Self-pay

## 2022-06-10 ENCOUNTER — Other Ambulatory Visit (HOSPITAL_COMMUNITY): Payer: Self-pay

## 2022-06-12 ENCOUNTER — Other Ambulatory Visit: Payer: Self-pay

## 2022-06-12 ENCOUNTER — Other Ambulatory Visit (HOSPITAL_COMMUNITY): Payer: Self-pay

## 2022-06-12 MED ORDER — BUPROPION HCL ER (XL) 150 MG PO TB24
150.0000 mg | ORAL_TABLET | Freq: Every morning | ORAL | 3 refills | Status: DC
  Filled 2022-06-12: qty 30, 30d supply, fill #0
  Filled 2022-07-17: qty 30, 30d supply, fill #1
  Filled 2022-08-20: qty 30, 30d supply, fill #2
  Filled 2022-09-19: qty 30, 30d supply, fill #3
  Filled 2022-10-17: qty 30, 30d supply, fill #4
  Filled 2022-11-25: qty 30, 30d supply, fill #5
  Filled 2022-12-25: qty 30, 30d supply, fill #6
  Filled 2023-01-26: qty 30, 30d supply, fill #7
  Filled 2023-02-27: qty 30, 30d supply, fill #8
  Filled 2023-03-28: qty 30, 30d supply, fill #9
  Filled 2023-05-01: qty 30, 30d supply, fill #10
  Filled 2023-06-04: qty 30, 30d supply, fill #11

## 2022-06-12 MED ORDER — METOPROLOL SUCCINATE ER 25 MG PO TB24
25.0000 mg | ORAL_TABLET | Freq: Every day | ORAL | 3 refills | Status: DC
  Filled 2022-06-12: qty 30, 30d supply, fill #0
  Filled 2022-08-20: qty 30, 30d supply, fill #1
  Filled 2022-09-19: qty 30, 30d supply, fill #2

## 2022-06-13 ENCOUNTER — Other Ambulatory Visit (HOSPITAL_COMMUNITY): Payer: Self-pay

## 2022-06-13 DIAGNOSIS — F411 Generalized anxiety disorder: Secondary | ICD-10-CM | POA: Diagnosis not present

## 2022-06-21 DIAGNOSIS — F411 Generalized anxiety disorder: Secondary | ICD-10-CM | POA: Diagnosis not present

## 2022-06-27 DIAGNOSIS — F411 Generalized anxiety disorder: Secondary | ICD-10-CM | POA: Diagnosis not present

## 2022-07-11 DIAGNOSIS — F411 Generalized anxiety disorder: Secondary | ICD-10-CM | POA: Diagnosis not present

## 2022-07-18 ENCOUNTER — Other Ambulatory Visit (HOSPITAL_COMMUNITY): Payer: Self-pay

## 2022-07-18 ENCOUNTER — Other Ambulatory Visit: Payer: Self-pay

## 2022-07-18 DIAGNOSIS — F411 Generalized anxiety disorder: Secondary | ICD-10-CM | POA: Diagnosis not present

## 2022-07-18 MED ORDER — LISDEXAMFETAMINE DIMESYLATE 60 MG PO CAPS
ORAL_CAPSULE | ORAL | 0 refills | Status: DC
  Filled 2022-07-18: qty 30, 30d supply, fill #0

## 2022-07-25 DIAGNOSIS — F411 Generalized anxiety disorder: Secondary | ICD-10-CM | POA: Diagnosis not present

## 2022-08-08 DIAGNOSIS — F411 Generalized anxiety disorder: Secondary | ICD-10-CM | POA: Diagnosis not present

## 2022-08-20 ENCOUNTER — Other Ambulatory Visit (HOSPITAL_COMMUNITY): Payer: Self-pay

## 2022-08-20 ENCOUNTER — Other Ambulatory Visit: Payer: Self-pay

## 2022-08-22 ENCOUNTER — Other Ambulatory Visit (HOSPITAL_COMMUNITY): Payer: Self-pay

## 2022-08-22 ENCOUNTER — Other Ambulatory Visit: Payer: Self-pay

## 2022-08-22 ENCOUNTER — Encounter (HOSPITAL_COMMUNITY): Payer: Self-pay

## 2022-08-22 DIAGNOSIS — F902 Attention-deficit hyperactivity disorder, combined type: Secondary | ICD-10-CM | POA: Diagnosis not present

## 2022-08-22 DIAGNOSIS — Z79899 Other long term (current) drug therapy: Secondary | ICD-10-CM | POA: Diagnosis not present

## 2022-08-22 MED ORDER — ARMODAFINIL 150 MG PO TABS
150.0000 mg | ORAL_TABLET | Freq: Every day | ORAL | 0 refills | Status: DC
  Filled 2022-08-22 (×2): qty 30, 30d supply, fill #0

## 2022-08-22 MED ORDER — LISDEXAMFETAMINE DIMESYLATE 60 MG PO CAPS
60.0000 mg | ORAL_CAPSULE | Freq: Every day | ORAL | 0 refills | Status: DC
  Filled 2022-08-22: qty 30, 30d supply, fill #0

## 2022-08-23 ENCOUNTER — Other Ambulatory Visit (HOSPITAL_COMMUNITY): Payer: Self-pay

## 2022-08-23 DIAGNOSIS — F902 Attention-deficit hyperactivity disorder, combined type: Secondary | ICD-10-CM | POA: Diagnosis not present

## 2022-08-29 DIAGNOSIS — Z131 Encounter for screening for diabetes mellitus: Secondary | ICD-10-CM | POA: Diagnosis not present

## 2022-08-29 DIAGNOSIS — F411 Generalized anxiety disorder: Secondary | ICD-10-CM | POA: Diagnosis not present

## 2022-08-29 DIAGNOSIS — E7800 Pure hypercholesterolemia, unspecified: Secondary | ICD-10-CM | POA: Diagnosis not present

## 2022-08-29 DIAGNOSIS — F321 Major depressive disorder, single episode, moderate: Secondary | ICD-10-CM | POA: Diagnosis not present

## 2022-08-29 DIAGNOSIS — F419 Anxiety disorder, unspecified: Secondary | ICD-10-CM | POA: Diagnosis not present

## 2022-08-29 DIAGNOSIS — Z0000 Encounter for general adult medical examination without abnormal findings: Secondary | ICD-10-CM | POA: Diagnosis not present

## 2022-09-19 ENCOUNTER — Other Ambulatory Visit (HOSPITAL_COMMUNITY): Payer: Self-pay

## 2022-09-19 DIAGNOSIS — F411 Generalized anxiety disorder: Secondary | ICD-10-CM | POA: Diagnosis not present

## 2022-09-19 MED ORDER — LISDEXAMFETAMINE DIMESYLATE 60 MG PO CAPS
60.0000 mg | ORAL_CAPSULE | Freq: Every day | ORAL | 0 refills | Status: DC
  Filled 2022-09-19: qty 30, 30d supply, fill #0

## 2022-09-21 ENCOUNTER — Other Ambulatory Visit (HOSPITAL_COMMUNITY): Payer: Self-pay

## 2022-10-07 ENCOUNTER — Other Ambulatory Visit (HOSPITAL_COMMUNITY): Payer: Self-pay

## 2022-10-07 DIAGNOSIS — L814 Other melanin hyperpigmentation: Secondary | ICD-10-CM | POA: Diagnosis not present

## 2022-10-07 DIAGNOSIS — D2261 Melanocytic nevi of right upper limb, including shoulder: Secondary | ICD-10-CM | POA: Diagnosis not present

## 2022-10-07 DIAGNOSIS — L738 Other specified follicular disorders: Secondary | ICD-10-CM | POA: Diagnosis not present

## 2022-10-07 DIAGNOSIS — D225 Melanocytic nevi of trunk: Secondary | ICD-10-CM | POA: Diagnosis not present

## 2022-10-07 MED ORDER — BETAMETHASONE DIPROPIONATE AUG 0.05 % EX CREA
TOPICAL_CREAM | CUTANEOUS | 0 refills | Status: DC
  Filled 2022-10-07: qty 15, 30d supply, fill #0

## 2022-10-12 ENCOUNTER — Other Ambulatory Visit (HOSPITAL_COMMUNITY): Payer: Self-pay

## 2022-10-14 ENCOUNTER — Encounter: Payer: Self-pay | Admitting: Internal Medicine

## 2022-10-17 ENCOUNTER — Other Ambulatory Visit (HOSPITAL_COMMUNITY): Payer: Self-pay

## 2022-10-19 ENCOUNTER — Other Ambulatory Visit (HOSPITAL_COMMUNITY): Payer: Self-pay

## 2022-10-24 DIAGNOSIS — F411 Generalized anxiety disorder: Secondary | ICD-10-CM | POA: Diagnosis not present

## 2022-10-25 ENCOUNTER — Encounter: Payer: Self-pay | Admitting: Internal Medicine

## 2022-10-25 ENCOUNTER — Other Ambulatory Visit (HOSPITAL_COMMUNITY): Payer: Self-pay

## 2022-10-25 ENCOUNTER — Ambulatory Visit (INDEPENDENT_AMBULATORY_CARE_PROVIDER_SITE_OTHER): Payer: BC Managed Care – PPO

## 2022-10-25 ENCOUNTER — Ambulatory Visit: Payer: BC Managed Care – PPO | Attending: Internal Medicine | Admitting: Internal Medicine

## 2022-10-25 VITALS — BP 118/66 | HR 111 | Ht 71.0 in | Wt 184.6 lb

## 2022-10-25 DIAGNOSIS — R002 Palpitations: Secondary | ICD-10-CM

## 2022-10-25 MED ORDER — METOPROLOL TARTRATE 25 MG PO TABS
25.0000 mg | ORAL_TABLET | Freq: Two times a day (BID) | ORAL | 3 refills | Status: DC
  Filled 2022-10-25: qty 60, 30d supply, fill #0
  Filled 2022-11-25: qty 60, 30d supply, fill #1
  Filled 2022-12-25: qty 60, 30d supply, fill #2
  Filled 2023-01-26: qty 60, 30d supply, fill #3
  Filled 2023-03-28: qty 60, 30d supply, fill #4
  Filled 2023-05-01: qty 60, 30d supply, fill #5
  Filled 2023-06-04: qty 60, 30d supply, fill #6
  Filled 2023-07-14 – 2023-08-20 (×2): qty 60, 30d supply, fill #7
  Filled 2023-09-23: qty 60, 30d supply, fill #8

## 2022-10-25 NOTE — Progress Notes (Unsigned)
Enrolled patient for a 7 day Zio XT monitor to be mailed to patients home.  

## 2022-10-25 NOTE — Patient Instructions (Addendum)
Medication Instructions:  Your physician has recommended you make the following change in your medication:  STOP TOPROL-XL 25 MG DAILY. START METOPROLOL 25 MG TWICE DAILY.   *If you need a refill on your cardiac medications before your next appointment, please call your pharmacy*   Lab Work: NONE If you have labs (blood work) drawn today and your tests are completely normal, you will receive your results only by: MyChart Message (if you have MyChart) OR A paper copy in the mail If you have any lab test that is abnormal or we need to change your treatment, we will call you to review the results.   Testing/Procedures: Christena Deem- Long Term Monitor Instructions  Your physician has requested you wear a ZIO patch monitor for 7 days.  This is a single patch monitor. [employer] supplies one patch monitor per enrollment. Additional stickers are not available. Please do not apply patch if you will be having a Nuclear Stress Test,  Echocardiogram, Cardiac CT, MRI, or Chest Xray during the period you would be wearing the  monitor. The patch cannot be worn during these tests. You cannot remove and re-apply the  ZIO XT patch monitor.  Your ZIO patch monitor will be mailed 3 day USPS to your address on file. It may take 3-5 days  to receive your monitor after you have been enrolled.  Once you have received your monitor, please review the enclosed instructions. Your monitor  has already been registered assigning a specific monitor serial # to you.  Billing and Patient Assistance Program Information  We have supplied [employer] with any of your insurance information on file for billing purposes. [employer] offers a sliding scale Patient Assistance Program for patients that do not have  insurance, or whose insurance does not completely cover the cost of the ZIO monitor.  You must apply for the Patient Assistance Program to qualify for this discounted rate.  To apply, please call [employer] at [phone], select  option 4, select option 2, ask to apply for  Patient Assistance Program. Meredeth Ide will ask your household income, and how many people  are in your household. They will quote your out-of-pocket cost based on that information.  [employer] will also be able to set up a 55-month, interest-free payment plan if needed.  Applying the monitor   Shave hair from upper left chest.  Hold abrader disc by orange tab. Rub abrader in 40 strokes over the upper left chest as  indicated in your monitor instructions.  Clean area with 4 enclosed alcohol pads. Let dry.  Apply patch as indicated in monitor instructions. Patch will be placed under collarbone on left  side of chest with arrow pointing upward.  Rub patch adhesive wings for 2 minutes. Remove white label marked "1". Remove the white  label marked "2". Rub patch adhesive wings for 2 additional minutes.  While looking in a mirror, press and release button in center of patch. A small green light will  flash 3-4 times. This will be your only indicator that the monitor has been turned on.  Do not shower for the first 24 hours. You may shower after the first 24 hours.  Press the button if you feel a symptom. You will hear a small click. Record Date, Time and  Symptom in the Patient Logbook.  When you are ready to remove the patch, follow instructions on the last 2 pages of Patient  Logbook. Stick patch monitor onto the last page of Patient Logbook.  Place Patient  Logbook in the blue and white box. Use locking tab on box and tape box closed  securely. The blue and white box has prepaid postage on it. Please place it in the mailbox as  soon as possible. Your physician should have your test results approximately 7 days after the  monitor has been mailed back to [facility].  Call [facility] Customer Care at [phone] if you have questions regarding  your ZIO XT patch monitor. Call them immediately if you see an orange light blinking on your  monitor.   If your monitor falls off in less than 4 days, contact our Monitor department at [phone].  If your monitor becomes loose or falls off after 4 days call [employer] at [phone] for  suggestions on securing your monitor    Follow-Up: At [facility], you and your health needs are our priority.  As part of our continuing mission to provide you with exceptional heart care, we have created designated Provider Care Teams.  These Care Teams include your primary Cardiologist (physician) and Advanced Practice Providers (APPs -  Physician Assistants and Nurse Practitioners) who all work together to provide you with the care you need, when you need it.  We recommend signing up for the patient portal called "MyChart".  Sign up information is provided on this After Visit Summary.  MyChart is used to connect with patients for Virtual Visits (Telemedicine).  Patients are able to view lab/test results, encounter notes, upcoming appointments, etc.  Non-urgent messages can be sent to your provider as well.   To learn more about what you can do with MyChart, go to [email or web address].    Your next appointment:   1 YEAR FOLLOW WITH DR. [facility]

## 2022-10-28 ENCOUNTER — Other Ambulatory Visit (HOSPITAL_COMMUNITY): Payer: Self-pay

## 2022-10-29 DIAGNOSIS — R002 Palpitations: Secondary | ICD-10-CM

## 2022-11-14 DIAGNOSIS — F411 Generalized anxiety disorder: Secondary | ICD-10-CM | POA: Diagnosis not present

## 2022-11-20 ENCOUNTER — Other Ambulatory Visit (HOSPITAL_COMMUNITY): Payer: Self-pay

## 2022-11-20 DIAGNOSIS — Z79899 Other long term (current) drug therapy: Secondary | ICD-10-CM | POA: Diagnosis not present

## 2022-11-20 DIAGNOSIS — F902 Attention-deficit hyperactivity disorder, combined type: Secondary | ICD-10-CM | POA: Diagnosis not present

## 2022-11-20 MED ORDER — LISDEXAMFETAMINE DIMESYLATE 60 MG PO CHEW
60.0000 mg | CHEWABLE_TABLET | Freq: Every day | ORAL | 0 refills | Status: DC
  Filled 2022-11-20: qty 30, 30d supply, fill #0

## 2022-11-21 DIAGNOSIS — F411 Generalized anxiety disorder: Secondary | ICD-10-CM | POA: Diagnosis not present

## 2022-11-28 DIAGNOSIS — F411 Generalized anxiety disorder: Secondary | ICD-10-CM | POA: Diagnosis not present

## 2022-12-05 DIAGNOSIS — F411 Generalized anxiety disorder: Secondary | ICD-10-CM | POA: Diagnosis not present

## 2023-01-02 DIAGNOSIS — F411 Generalized anxiety disorder: Secondary | ICD-10-CM | POA: Diagnosis not present

## 2023-01-09 DIAGNOSIS — F411 Generalized anxiety disorder: Secondary | ICD-10-CM | POA: Diagnosis not present

## 2023-01-16 DIAGNOSIS — F411 Generalized anxiety disorder: Secondary | ICD-10-CM | POA: Diagnosis not present

## 2023-01-17 ENCOUNTER — Other Ambulatory Visit (HOSPITAL_BASED_OUTPATIENT_CLINIC_OR_DEPARTMENT_OTHER): Payer: Self-pay

## 2023-01-17 MED ORDER — LISDEXAMFETAMINE DIMESYLATE 60 MG PO CHEW
60.0000 mg | CHEWABLE_TABLET | Freq: Every day | ORAL | 0 refills | Status: DC
  Filled 2023-01-17: qty 30, 30d supply, fill #0

## 2023-02-13 DIAGNOSIS — F411 Generalized anxiety disorder: Secondary | ICD-10-CM | POA: Diagnosis not present

## 2023-02-20 ENCOUNTER — Other Ambulatory Visit (HOSPITAL_BASED_OUTPATIENT_CLINIC_OR_DEPARTMENT_OTHER): Payer: Self-pay

## 2023-02-20 DIAGNOSIS — Z79899 Other long term (current) drug therapy: Secondary | ICD-10-CM | POA: Diagnosis not present

## 2023-02-20 DIAGNOSIS — F411 Generalized anxiety disorder: Secondary | ICD-10-CM | POA: Diagnosis not present

## 2023-02-20 DIAGNOSIS — F902 Attention-deficit hyperactivity disorder, combined type: Secondary | ICD-10-CM | POA: Diagnosis not present

## 2023-02-20 MED ORDER — LISDEXAMFETAMINE DIMESYLATE 30 MG PO CAPS
30.0000 mg | ORAL_CAPSULE | Freq: Two times a day (BID) | ORAL | 0 refills | Status: DC
  Filled 2023-02-20: qty 50, 25d supply, fill #0
  Filled 2023-02-20: qty 10, 5d supply, fill #0

## 2023-02-24 ENCOUNTER — Other Ambulatory Visit (HOSPITAL_BASED_OUTPATIENT_CLINIC_OR_DEPARTMENT_OTHER): Payer: Self-pay

## 2023-02-24 MED ORDER — LISDEXAMFETAMINE DIMESYLATE 30 MG PO CHEW
30.0000 mg | CHEWABLE_TABLET | Freq: Two times a day (BID) | ORAL | 0 refills | Status: DC
  Filled 2023-02-24: qty 60, 30d supply, fill #0

## 2023-02-27 DIAGNOSIS — F411 Generalized anxiety disorder: Secondary | ICD-10-CM | POA: Diagnosis not present

## 2023-03-06 DIAGNOSIS — F411 Generalized anxiety disorder: Secondary | ICD-10-CM | POA: Diagnosis not present

## 2023-03-13 DIAGNOSIS — F411 Generalized anxiety disorder: Secondary | ICD-10-CM | POA: Diagnosis not present

## 2023-03-14 ENCOUNTER — Other Ambulatory Visit (HOSPITAL_COMMUNITY): Payer: Self-pay

## 2023-03-14 DIAGNOSIS — R059 Cough, unspecified: Secondary | ICD-10-CM | POA: Diagnosis not present

## 2023-03-14 MED ORDER — OSELTAMIVIR PHOSPHATE 75 MG PO CAPS
75.0000 mg | ORAL_CAPSULE | Freq: Two times a day (BID) | ORAL | 0 refills | Status: DC
  Filled 2023-03-14: qty 10, 5d supply, fill #0

## 2023-03-20 DIAGNOSIS — F411 Generalized anxiety disorder: Secondary | ICD-10-CM | POA: Diagnosis not present

## 2023-03-24 ENCOUNTER — Other Ambulatory Visit (HOSPITAL_COMMUNITY): Payer: Self-pay

## 2023-03-27 DIAGNOSIS — F411 Generalized anxiety disorder: Secondary | ICD-10-CM | POA: Diagnosis not present

## 2023-04-03 DIAGNOSIS — F411 Generalized anxiety disorder: Secondary | ICD-10-CM | POA: Diagnosis not present

## 2023-05-03 ENCOUNTER — Other Ambulatory Visit (HOSPITAL_BASED_OUTPATIENT_CLINIC_OR_DEPARTMENT_OTHER): Payer: Self-pay

## 2023-05-12 ENCOUNTER — Telehealth (HOSPITAL_COMMUNITY): Payer: Self-pay

## 2023-05-12 ENCOUNTER — Encounter (HOSPITAL_COMMUNITY): Payer: Self-pay | Admitting: *Deleted

## 2023-05-12 ENCOUNTER — Other Ambulatory Visit (HOSPITAL_BASED_OUTPATIENT_CLINIC_OR_DEPARTMENT_OTHER): Payer: Self-pay

## 2023-05-12 ENCOUNTER — Ambulatory Visit (HOSPITAL_COMMUNITY)
Admission: EM | Admit: 2023-05-12 | Discharge: 2023-05-12 | Disposition: A | Discharge status: Home / Self Care | Attending: Physician Assistant | Admitting: Physician Assistant

## 2023-05-12 ENCOUNTER — Other Ambulatory Visit: Payer: Self-pay

## 2023-05-12 DIAGNOSIS — R112 Nausea with vomiting, unspecified: Secondary | ICD-10-CM

## 2023-05-12 DIAGNOSIS — R197 Diarrhea, unspecified: Secondary | ICD-10-CM | POA: Diagnosis not present

## 2023-05-12 LAB — POCT URINALYSIS DIP (MANUAL ENTRY)
Glucose, UA: NEGATIVE mg/dL
Leukocytes, UA: NEGATIVE
Nitrite, UA: NEGATIVE
Protein Ur, POC: 100 mg/dL — AB
Spec Grav, UA: 1.025
Urobilinogen, UA: 0.2 U/dL
pH, UA: 6.5

## 2023-05-12 MED ORDER — ONDANSETRON HCL 4 MG PO TABS: 4.0000 mg | ORAL_TABLET | Freq: Three times a day (TID) | ORAL | 0 refills | Status: DC | PRN

## 2023-05-12 MED ORDER — ONDANSETRON 4 MG PO TBDP
ORAL_TABLET | ORAL | Status: AC
  Filled 2023-05-12: qty 1

## 2023-05-12 MED ORDER — ONDANSETRON HCL 4 MG PO TABS
4.0000 mg | ORAL_TABLET | Freq: Three times a day (TID) | ORAL | 0 refills | Status: DC | PRN
  Filled 2023-05-12: qty 9, 30d supply, fill #0

## 2023-05-12 MED ORDER — ONDANSETRON 4 MG PO TBDP
4.0000 mg | ORAL_TABLET | Freq: Once | ORAL | Status: AC
  Administered 2023-05-12: 4 mg via ORAL

## 2023-05-12 NOTE — Telephone Encounter (Signed)
 Patient requested RX be sent to [facility].

## 2023-05-12 NOTE — ED Triage Notes (Signed)
 PT reports since Friday after eating out . Pt reports diarrhea ,vomiting and is dehydrated. Pt has only eaten a hot dog ,granola bar . Pt reports he has not improved.

## 2023-05-12 NOTE — Discharge Instructions (Addendum)
 Take Zofran as needed for nausea and vomiting. Push fluids. Can continue with Imodium as needed. Symptoms may last 5 to 7 days.  If persist after 7 days or you cannot keep fluids down please return for evaluation.

## 2023-05-12 NOTE — ED Provider Notes (Signed)
 [facility]    CSN: [id number] Arrival date & time: 05/12/23  1533      History   Chief Complaint Chief Complaint  Patient presents with   Diarrhea   Emesis   Fever    HPI Harry Anderson is a 33 y.o. male.   Pt complains of diarrhea, nausea, and vomiting that started 4 days ago after eating a meal on Friday night.  Several other people were ill after eating same meal. He reports vomiting started today today.  Reports dull abdominal discomfort.  Denies fever, urinary symptoms.  Reports nausea at this moment.     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 Authorizing Provider  buPROPion (WELLBUTRIN XL) 150 MG 24 hr tablet Take 1 tablet (150 mg total) by mouth every morning. 06/12/22  Yes   lisdexamfetamine (VYVANSE) 30 MG chewable tablet Take 1 tablet by mouth twice a day 02/24/23  Yes   metoprolol tartrate (LOPRESSOR) 25 MG tablet Take 1 tablet (25 mg total) by mouth 2 (two) times daily. 10/25/22 06/02/23 Yes BranchAlben Spittle, MD  ondansetron (ZOFRAN) 4 MG tablet Take 1 tablet (4 mg total) by mouth every 8 (eight) hours as needed for nausea or vomiting. 05/12/23  Yes Ward, Tylene Fantasia, PA-C  amphetamine-dextroamphetamine (ADDERALL) 15 MG tablet Take 1 tablet by mouth daily. Patient not taking: Reported on 10/25/2022 05/27/22     Armodafinil 150 MG tablet Take 1 tablet (150 mg total) by mouth daily. Patient not taking: Reported on 10/25/2022 08/22/22      augmented betamethasone dipropionate (DIPROLENE-AF) 0.05 % cream Apply a small amount to skin twice a day. Apply to affected area until completely flat, approximately 4-6 weeks, then as needed Patient not taking: Reported on 10/25/2022 10/07/22     cholecalciferol (VITAMIN D3) 25 MCG (1000 UNIT) tablet Take by mouth. Patient not taking: Reported on 10/25/2022 07/16/19   [provider]  Cyanocobalamin (PHYSICIANS EZ USE B-12) 1000 MCG/ML KIT Inject 1,000 mcg as directed once a week. Patient not taking: Reported on 10/25/2022 07/16/19   [provider]  cyanocobalamin (VITAMIN B12) 1000 MCG tablet Take by mouth. Patient not taking: Reported on 10/25/2022 07/16/19   [provider]  lisdexamfetamine (VYVANSE) 60 MG capsule Take 1 capsule by mouth daily Patient not taking: Reported on 10/25/2022 07/17/22     lisdexamfetamine (VYVANSE) 60 MG capsule Take 1 capsule (60 mg total) by mouth daily. 09/19/22     Lisdexamfetamine Dimesylate 60 MG CHEW Chew 1 tablet (60 mg total) by mouth daily. 01/17/23     meloxicam (MOBIC) 7.5 MG tablet Take 1 tablet (7.5 mg total) by mouth daily. Patient not taking: Reported on 10/25/2022 05/15/22   Milderd Meager., MD  metoprolol succinate (TOPROL-XL) 25 MG 24 hr tablet Take 1 tablet (25 mg total) by mouth daily. Patient not taking: Reported on 10/25/2022 06/12/22  Metoprolol-Hydrochlorothiazide (DUTOPROL) 100-12.5 MG TB24 Take 1 tablet by mouth daily. Patient not taking: Reported on 10/25/2022 08/18/19   [provider]  oseltamivir (TAMIFLU) 75 MG capsule Take 1 capsule (75 mg total) by mouth 2 (two) times daily for 5 days. 03/14/23       Family History Family History  Problem Relation Age of Onset   Atrial fibrillation Mother    Ehlers-Danlos syndrome Sister    Heart attack Maternal Grandfather    Breast cancer Other    Colon cancer Other     Social History Social History   Tobacco Use   Smoking status: Never  Substance Use  Topics   Alcohol use: No     Allergies   Codeine sulfate   Review of Systems Review of Systems  Constitutional:  Negative for chills and fever.  HENT:  Negative for ear pain and sore throat.   Eyes:  Negative for pain and visual disturbance.  Respiratory:  Negative for cough and shortness of breath.   Cardiovascular:  Negative for chest pain and palpitations.  Gastrointestinal:  Positive for diarrhea, nausea and vomiting. Negative for abdominal pain.  Genitourinary:  Negative for dysuria and hematuria.  Musculoskeletal:  Negative for arthralgias and back pain.  Skin:  Negative for color change and rash.  Neurological:  Negative for seizures and syncope.  All other systems reviewed and are negative.    Physical Exam Triage Vital Signs ED Triage Vitals  Encounter Vitals Group     BP 05/12/23 1557 120/89     Systolic BP Percentile --      Diastolic BP Percentile --      Pulse Rate 05/12/23 1557 100     Resp 05/12/23 1557 18     Temp 05/12/23 1557 98.2 F (36.8 C)     Temp src --      SpO2 05/12/23 1557 94 %     Weight --      Height --      Head Circumference --      Peak Flow --      Pain Score 05/12/23 1555 1     Pain Loc --      Pain Education --      Exclude from Growth Chart --    No data found.  Updated Vital Signs BP 120/89   Pulse 100   Temp 98.2 F (36.8 C)   Resp 18   SpO2 94%   Visual Acuity Right Eye Distance:   Left Eye Distance:   Bilateral Distance:    Right Eye Near:   Left Eye Near:    Bilateral Near:     Physical Exam Vitals and nursing note reviewed.  Constitutional:      General: He is not in acute distress.    Appearance: He is well-developed.  HENT:     Head: Normocephalic and atraumatic.  Eyes:     Conjunctiva/sclera: Conjunctivae normal.  Cardiovascular:     Rate and Rhythm: Normal rate and regular rhythm.     Heart sounds: No murmur heard. Pulmonary:     Effort: Pulmonary effort is normal. No respiratory distress.      Breath sounds: Normal breath sounds.  Abdominal:     Palpations: Abdomen is soft.     Tenderness: There is no abdominal tenderness.  Musculoskeletal:        General: No swelling.     Cervical back: Neck supple.  Skin:    General: Skin is warm and dry.  Capillary Refill: Capillary refill takes less than 2 seconds.  Neurological:     Mental Status: He is alert.  Psychiatric:        Mood and Affect: Mood normal.      UC Treatments / Results  Labs (all labs ordered are listed, but only abnormal results are displayed) Labs Reviewed  POCT URINALYSIS DIP (MANUAL ENTRY) - Abnormal; Notable for the following components:      Result Value   Color, UA other (*)    Bilirubin, UA small (*)    Ketones, POC UA trace (5) (*)    Blood, UA small (*)    Protein Ur, POC =100 (*)    All other components within normal limits    EKG   Radiology No results found.  Procedures Procedures (including critical care time)  Medications Ordered in UC Medications  ondansetron (ZOFRAN-ODT) disintegrating tablet 4 mg (4 mg Oral Given 05/12/23 1616)    Initial Impression / Assessment and Plan / UC Course  I have reviewed the triage vital signs and the nursing notes.  Pertinent labs & imaging results that were available during my care of the patient were reviewed by me and considered in my medical decision making (see chart for details).     Nausea, vomiting, diarrhea possibly foodborne.  Zofran given in clinic today, prescription to take at home.  Patient in no acute distress.  Vitals within normal limits, benign abdominal exam.  Supportive care discussed.  Return precautions discussed. Final Clinical Impressions(s) / UC Diagnoses   Final diagnoses:  Nausea vomiting and diarrhea     Discharge Instructions      Take Zofran as needed for nausea and vomiting. Push fluids. Can continue with Imodium as needed. Symptoms may last 5 to 7 days.  If persist after 7 days or you cannot keep  fluids down please return for evaluation.     ED Prescriptions     Medication Sig Dispense Auth. Provider   ondansetron (ZOFRAN) 4 MG tablet Take 1 tablet (4 mg total) by mouth every 8 (eight) hours as needed for nausea or vomiting. 20 tablet Ward, Xavia Kniskern Z, PA-C      PDMP not reviewed this encounter.   Ward, Char Common, PA-C 05/12/23 1644

## 2023-05-22 DIAGNOSIS — F411 Generalized anxiety disorder: Secondary | ICD-10-CM | POA: Diagnosis not present

## 2023-06-04 ENCOUNTER — Other Ambulatory Visit (HOSPITAL_BASED_OUTPATIENT_CLINIC_OR_DEPARTMENT_OTHER): Payer: Self-pay

## 2023-06-04 ENCOUNTER — Other Ambulatory Visit: Payer: Self-pay

## 2023-06-06 ENCOUNTER — Other Ambulatory Visit (HOSPITAL_BASED_OUTPATIENT_CLINIC_OR_DEPARTMENT_OTHER): Payer: Self-pay

## 2023-06-06 MED ORDER — LISDEXAMFETAMINE DIMESYLATE 30 MG PO CHEW
30.0000 mg | CHEWABLE_TABLET | Freq: Two times a day (BID) | ORAL | 0 refills | Status: DC
  Filled 2023-06-06: qty 60, 30d supply, fill #0

## 2023-06-09 ENCOUNTER — Other Ambulatory Visit (HOSPITAL_BASED_OUTPATIENT_CLINIC_OR_DEPARTMENT_OTHER): Payer: Self-pay

## 2023-06-12 DIAGNOSIS — F411 Generalized anxiety disorder: Secondary | ICD-10-CM | POA: Diagnosis not present

## 2023-07-14 ENCOUNTER — Other Ambulatory Visit (HOSPITAL_COMMUNITY): Payer: Self-pay

## 2023-07-14 ENCOUNTER — Other Ambulatory Visit (HOSPITAL_BASED_OUTPATIENT_CLINIC_OR_DEPARTMENT_OTHER): Payer: Self-pay

## 2023-07-14 ENCOUNTER — Other Ambulatory Visit: Payer: Self-pay

## 2023-07-14 MED ORDER — BUPROPION HCL ER (XL) 150 MG PO TB24
150.0000 mg | ORAL_TABLET | Freq: Every morning | ORAL | 3 refills | Status: AC
  Filled 2023-07-14: qty 30, 30d supply, fill #0
  Filled 2023-08-20: qty 30, 30d supply, fill #1
  Filled 2023-09-23: qty 30, 30d supply, fill #2

## 2023-07-24 ENCOUNTER — Other Ambulatory Visit (HOSPITAL_BASED_OUTPATIENT_CLINIC_OR_DEPARTMENT_OTHER): Payer: Self-pay

## 2023-08-31 ENCOUNTER — Other Ambulatory Visit: Payer: Self-pay

## 2023-08-31 ENCOUNTER — Emergency Department (HOSPITAL_BASED_OUTPATIENT_CLINIC_OR_DEPARTMENT_OTHER)
Admission: EM | Admit: 2023-08-31 | Discharge: 2023-08-31 | Disposition: A | Discharge status: Home / Self Care | Attending: Emergency Medicine | Admitting: Emergency Medicine

## 2023-08-31 ENCOUNTER — Encounter (HOSPITAL_BASED_OUTPATIENT_CLINIC_OR_DEPARTMENT_OTHER): Payer: Self-pay | Admitting: *Deleted

## 2023-08-31 ENCOUNTER — Emergency Department (HOSPITAL_BASED_OUTPATIENT_CLINIC_OR_DEPARTMENT_OTHER)

## 2023-08-31 DIAGNOSIS — R0789 Other chest pain: Secondary | ICD-10-CM | POA: Diagnosis not present

## 2023-08-31 DIAGNOSIS — R079 Chest pain, unspecified: Secondary | ICD-10-CM | POA: Diagnosis not present

## 2023-08-31 DIAGNOSIS — R002 Palpitations: Secondary | ICD-10-CM | POA: Insufficient documentation

## 2023-08-31 DIAGNOSIS — I471 Supraventricular tachycardia, unspecified: Secondary | ICD-10-CM | POA: Diagnosis not present

## 2023-08-31 LAB — CBC WITH DIFFERENTIAL/PLATELET
Abs Immature Granulocytes: 0.01 K/uL (ref 0.00–0.07)
Basophils Absolute: 0 K/uL (ref 0.0–0.1)
Basophils Relative: 0 %
Eosinophils Absolute: 0.2 K/uL (ref 0.0–0.5)
Eosinophils Relative: 4 %
HCT: 44.5 % (ref 39.0–52.0)
Hemoglobin: 15.4 g/dL (ref 13.0–17.0)
Immature Granulocytes: 0 %
Lymphocytes Relative: 42 %
Lymphs Abs: 1.9 K/uL (ref 0.7–4.0)
MCH: 30.4 pg (ref 26.0–34.0)
MCHC: 34.6 g/dL (ref 30.0–36.0)
MCV: 87.8 fL (ref 80.0–100.0)
Monocytes Absolute: 0.3 K/uL (ref 0.1–1.0)
Monocytes Relative: 7 %
Neutro Abs: 2.1 K/uL (ref 1.7–7.7)
Neutrophils Relative %: 47 %
Platelets: 183 K/uL (ref 150–400)
RBC: 5.07 MIL/uL (ref 4.22–5.81)
RDW: 13.4 % (ref 11.5–15.5)
WBC: 4.5 K/uL (ref 4.0–10.5)
nRBC: 0 % (ref 0.0–0.2)

## 2023-08-31 LAB — BASIC METABOLIC PANEL WITH GFR
Anion gap: 13 (ref 5–15)
BUN: 14 mg/dL (ref 6–20)
CO2: 23 mmol/L (ref 22–32)
Calcium: 9.6 mg/dL (ref 8.9–10.3)
Chloride: 107 mmol/L (ref 98–111)
Creatinine, Ser: 0.98 mg/dL (ref 0.61–1.24)
GFR, Estimated: >60 mL/min (ref >60)
Glucose, Bld: 103 mg/dL — ABNORMAL HIGH (ref 70–99)
Potassium: 3.7 mmol/L (ref 3.5–5.1)
Sodium: 142 mmol/L (ref 135–145)

## 2023-08-31 LAB — D-DIMER, QUANTITATIVE: D-Dimer, Quant: <0.27 ug{FEU}/mL (ref 0.00–0.50)

## 2023-08-31 LAB — TROPONIN T, HIGH SENSITIVITY: Troponin T High Sensitivity: <15 ng/L (ref <19)

## 2023-08-31 NOTE — ED Triage Notes (Signed)
 Pt to ED POV from home reporting sudden onset of left sided chest pain radiating to his left upper back x 1 hour. Shortness of breath intermittently.   Hx of SVT

## 2023-08-31 NOTE — ED Provider Notes (Signed)
 [place] EMERGENCY DEPARTMENT AT [facility] Provider Note   CSN: [id number] Arrival date & time: 08/31/23  1042     Patient presents with: Chest Pain and Shortness of Breath   Harry Anderson is a 33 y.o. male.   The history is provided by the patient.       Prior to Admission medications   Medication Sig Start Date End Date Taking? Authorizing Provider  amphetamine -dextroamphetamine  (ADDERALL) 15 MG tablet Take 1 tablet by mouth daily. Patient not taking: Reported on 10/25/2022 05/27/22     Armodafinil  150 MG tablet Take 1 tablet (150 mg total) by mouth daily. Patient not taking: Reported on 10/25/2022 08/22/22     augmented betamethasone  dipropionate (DIPROLENE -AF) 0.05 % cream Apply a small amount to skin twice a day. Apply to affected area until completely flat, approximately 4-6 weeks, then as needed Patient not taking: Reported on 10/25/2022 10/07/22     buPROPion  (WELLBUTRIN  XL) 150 MG 24 hr tablet Take 1 tablet (150 mg total) by mouth every morning. 07/14/23     cholecalciferol (VITAMIN D3) 25 MCG (1000 UNIT) tablet Take by mouth. Patient not taking: Reported on 10/25/2022 07/16/19   [provider]  Cyanocobalamin (PHYSICIANS EZ USE B-12) 1000 MCG/ML KIT Inject 1,000 mcg as directed once a week. Patient not taking: Reported on 10/25/2022 07/16/19   [provider]  cyanocobalamin (VITAMIN B12) 1000 MCG tablet Take by mouth. Patient not taking: Reported on 10/25/2022 07/16/19   [provider]  lisdexamfetamine (VYVANSE ) 30 MG chewable tablet Take 1 tablet by mouth twice a day 02/24/23     lisdexamfetamine (VYVANSE ) 30 MG chewable tablet Chew 1 tablet (30 mg total) by mouth 2 (two) times daily. 06/06/23     lisdexamfetamine (VYVANSE ) 60 MG capsule Take 1 capsule by mouth daily Patient not taking: Reported on 10/25/2022 07/17/22     lisdexamfetamine (VYVANSE ) 60 MG capsule Take 1 capsule (60 mg total) by mouth daily. 09/19/22     Lisdexamfetamine  Dimesylate 60 MG CHEW Chew 1 tablet (60 mg total) by mouth daily. 01/17/23     meloxicam  (MOBIC ) 7.5 MG tablet Take 1 tablet (7.5 mg total) by mouth daily. Patient not taking: Reported on 10/25/2022 05/15/22   Roseann Harry PARAS., MD  metoprolol  succinate (TOPROL -XL) 25 MG 24 hr tablet Take 1 tablet (25 mg total) by mouth daily. Patient not taking: Reported on 10/25/2022 06/12/22     metoprolol  tartrate (LOPRESSOR ) 25 MG tablet Take 1 tablet (25 mg total) by mouth 2 (two) times daily. 10/25/22 09/20/23  Alvan Ronal BRAVO, MD  Metoprolol -Hydrochlorothiazide (DUTOPROL) 100-12.5 MG TB24 Take 1 tablet by mouth daily. Patient not taking: Reported on 10/25/2022 08/18/19   [provider]  ondansetron  (ZOFRAN ) 4 MG tablet Take 1 tablet (4 mg total) by mouth every 8 (eight) hours as needed for nausea or vomiting. 05/12/23   Ward, Harlene PEDLAR, PA-C  oseltamivir  (TAMIFLU ) 75 MG capsule Take 1 capsule (75 mg total) by mouth 2 (two) times daily for 5 days. 03/14/23       Allergies: Codeine sulfate    Review of Systems  Updated Vital Signs BP (!) 147/82 (BP Location: Right Arm)   Pulse 82   Temp 99.2 F (37.3 C) (Oral)   Resp 16   SpO2 99%   Physical Exam Vitals and nursing note reviewed.  Constitutional:      General: He is not in acute distress.    Appearance: He is well-developed. He is not ill-appearing.  HENT:     Head: Normocephalic and atraumatic.     Nose: Nose normal.     Mouth/Throat:     Mouth: Mucous membranes are moist.  Eyes:     Extraocular Movements: Extraocular movements intact.     Conjunctiva/sclera: Conjunctivae normal.     Pupils: Pupils are equal, round, and reactive to light.  Cardiovascular:     Rate and Rhythm: Normal rate and regular rhythm.     Pulses: Normal pulses.     Heart sounds: Normal heart sounds. No murmur heard. Pulmonary:     Effort: Pulmonary effort is normal. No respiratory distress.     Breath sounds: Normal breath sounds.  Abdominal:     General:  Abdomen is flat.     Palpations: Abdomen is soft.     Tenderness: There is no abdominal tenderness.  Musculoskeletal:        General: No swelling.     Cervical back: Normal range of motion and neck supple.  Skin:    General: Skin is warm and dry.     Capillary Refill: Capillary refill takes less than 2 seconds.  Neurological:     General: No focal deficit present.     Mental Status: He is alert.  Psychiatric:        Mood and Affect: Mood normal.     (all labs ordered are listed, but only abnormal results are displayed) Labs Reviewed  BASIC METABOLIC PANEL WITH GFR - Abnormal; Notable for the following components:      Result Value   Glucose, Bld 103 (*)    All other components within normal limits  CBC WITH DIFFERENTIAL/PLATELET  D-DIMER, QUANTITATIVE  TROPONIN T, HIGH SENSITIVITY    EKG: EKG Interpretation Date/Time:  Sunday August 31 2023 10:48:59 EDT Ventricular Rate:  113 PR Interval:  140 QRS Duration:  82 QT Interval:  330 QTC Calculation: 453 R Axis:   83  Text Interpretation: Sinus tachycardia Confirmed by Ruthe Cornet [phone]) on 08/31/2023 10:52:02 AM  Radiology: DG Chest Portable 1 View Result Date: 08/31/2023 EXAM: 1 VIEW XRAY OF THE CHEST 08/31/2023 11:00:00 AM COMPARISON: CTA chest 04/16/2021 CLINICAL HISTORY: C/o Lt sided chest pain today. Hx SVT. FINDINGS: LUNGS AND PLEURA: No focal pulmonary opacity. No pulmonary edema. No pleural effusion. No pneumothorax. HEART AND MEDIASTINUM: No acute abnormality of the cardiac and mediastinal silhouettes. BONES AND SOFT TISSUES: No acute osseous abnormality. IMPRESSION: 1. No acute process. Electronically signed by: Waddell Calk MD 08/31/2023 11:06 AM EDT RP Workstation: [id number]     Procedures   Medications Ordered in the ED - No data to display                                  Medical Decision Making Amount and/or Complexity of Data Reviewed Labs: ordered. Radiology: ordered.   Harry Anderson is  here with chest pain palpitations.  History of anxiety SVT.  EKG shows sinus tachycardia.  No ischemic changes.  No SVT.  Will check CBC BMP troponin D-dimer chest x-ray and reevaluate.  Differential diagnosis likely anxiety related process or MSK related process but will rule out ACS PE infectious process.  I have very low suspicion for pericarditis or myocarditis.  No concern for dissection.  Denies drug use including cocaine.  Occasional alcohol use.  No abdominal pain.  Doubt pancreatitis or cholecystitis.  Overall per my review and interpretation labs no significant leukocytosis anemia  or electrolyte abnormality.  Troponin unremarkable.  D-dimer normal.  Chest x-ray shows no evidence of pneumonia or pneumothorax.  Overall suspect stress related process.  Discharge.  Understand return precautions.  This chart was dictated using voice recognition software.  Despite best efforts to proofread,  errors can occur which can change the documentation meaning.      Final diagnoses:  Atypical chest pain    ED Discharge Orders     None          Ruthe Cornet, DO 08/31/23 1204

## 2023-09-04 ENCOUNTER — Other Ambulatory Visit (HOSPITAL_COMMUNITY): Payer: Self-pay

## 2023-09-04 DIAGNOSIS — D225 Melanocytic nevi of trunk: Secondary | ICD-10-CM | POA: Diagnosis not present

## 2023-09-04 DIAGNOSIS — Z0000 Encounter for general adult medical examination without abnormal findings: Secondary | ICD-10-CM | POA: Diagnosis not present

## 2023-09-04 DIAGNOSIS — L814 Other melanin hyperpigmentation: Secondary | ICD-10-CM | POA: Diagnosis not present

## 2023-09-04 DIAGNOSIS — F321 Major depressive disorder, single episode, moderate: Secondary | ICD-10-CM | POA: Diagnosis not present

## 2023-09-04 DIAGNOSIS — F419 Anxiety disorder, unspecified: Secondary | ICD-10-CM | POA: Diagnosis not present

## 2023-09-04 DIAGNOSIS — D2261 Melanocytic nevi of right upper limb, including shoulder: Secondary | ICD-10-CM | POA: Diagnosis not present

## 2023-09-04 DIAGNOSIS — D2262 Melanocytic nevi of left upper limb, including shoulder: Secondary | ICD-10-CM | POA: Diagnosis not present

## 2023-09-04 MED ORDER — BUPROPION HCL ER (XL) 150 MG PO TB24
150.0000 mg | ORAL_TABLET | Freq: Every morning | ORAL | 3 refills | Status: DC
  Filled 2023-09-04 – 2023-10-28 (×2): qty 30, 30d supply, fill #0

## 2023-09-04 NOTE — Progress Notes (Unsigned)
 Cardiology Office Note    Date:  09/05/2023  ID:  Harry Anderson, DOB [birth year], MRN [id number] PCP:  Regino Slater, MD  Cardiologist:  Alvan Ronal BRAVO, MD (Inactive)  Electrophysiologist:  None   Chief Complaint: Follow up for chest pain and palpitations  History of Present Illness: .   Harry Anderson is a 33 y.o. male with visit-pertinent history of anxiety, ADHD, SVT.  Per notes patient has history of syncopal episode in 2012.  Patient woke with bowel pain and sudden urge to defecate and noted palpitations and hot flash.  He reported passing out and losing consciousness.  Patient had a Holter monitor read baseline rhythm as sinus with sinus bradycardia as well as episode of SVT.  He was prescribed metoprolol  XL 25 mg daily.  While in [place] in 2012 he was instructed to stop metoprolol .  From 20 15-20 16 he saw a cardiologist in [place], he had a repeat 4-week monitor which noted runs of sinus tachycardia in 08/2014.  Patient had heart rates up to 157 noted to sinus tachycardia.  He was restarted on metoprolol  12.5 mg XL, per notes had a bubble study in [place] however there is no records of his echocardiogram.  Patient establish care with Dr. Ronal Alvan in 11/2020.  Echocardiogram at that time indicated LVEF 60 to 65%, no RWMA, diastolic parameters were normal, RV systolic function and size was normal, trivial mitral valve regurgitation, aortic valve sclerosis was present without evidence of stenosis.  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  Patient's cardiac monitor indicated an average heart rate of 89 bpm, ranging from 49 to 187 bpm.  Predominant underlying rhythm was sinus rhythm.  He had 2 runs of ventricular tachycardia, run with the fastest interval lasting 7 beats with a max rate of 158 bpm, run with the fastest interval was also the longest.  Ventricular tachycardia was detected within 45 seconds of symptomatic  patient's events.  Patient's metoprolol  was continued.  Patient was last seen in clinic by Dr. Alvan on 10/25/2022.  Per notes patient had restarted on Adderall, had noted increased heart rates and his metoprolol  was increased to 25 mg daily.  It was noted that patient was also on Vyvanse  which was likely contributing to symptoms.  Patient's metoprolol  was transitioned to 25 mg tartrate twice daily.  Cardiac monitor in 10/2022 indicated an average heart rate of 73 bpm, ranging from 43 to 143 bpm.  Predominant underlying rhythm was sinus rhythm.  There were 9 triggered events associated with sinus rhythm.  On 08/31/2023 patient presented to the emergency department with chest pain and palpitations.  EKG was without ischemic changes, no evidence of SVT.  Patient's high sensitive troponin was negative, D-dimer was normal.  Patient was discharged with recommendation to follow-up with PCP.  Today he presents for hospital follow up.  Patient reports that he presented to the emergency room after he lifted his young daughter out of her bassinet, then experienced left-sided chest pain that radiated up into his shoulder.  ED workup overall reassuring as noted above.  He reports on Wednesday after waking up between 3:45 and 4 AM to feed his daughter he was sitting and relaxing with her when his heart rate increased up to 160 to 180 bpm with associated shortness of breath.  Patient reports that it felt as though his heart was beating harder.  He was unable to capture episode  on his cardia mobile.  Patient reports that he has not been regularly taking his metoprolol  tartrate, if taking will only take once a day, he does not believe that he took his medication in hours prior to both events.  Patient notes significant family history: Patient reports that a few months ago his father went into cardiac arrest while at work, per his report was in [place] and is now with an ICD.  Per patient workup was unrevealing including no  evidence of MI, felt to possibly be related to low potassium levels.  His mother had an aortic valve and root replacement also has history of atrial fibrillation s/p PPM.  Patient sister has POTS and Ehrler's Danlos.  Labwork independently reviewed: 08/31/2023: Sodium 142, potassium 3.7, creatinine 0.98, hemoglobin 15.4, hematocrit 44.5 ROS: .   Today he denies lower extremity edema, fatigue, melena, hematuria, hemoptysis, diaphoresis, weakness, presyncope, syncope, orthopnea, and PND.  All other systems are reviewed and otherwise negative. Studies Reviewed: SABRA   EKG:  EKG is ordered today, personally reviewed, demonstrating  EKG Interpretation Date/Time:  Friday September 05 2023 15:21:15 EDT Ventricular Rate:  70 PR Interval:  134 QRS Duration:  102 QT Interval:  386 QTC Calculation: 416 R Axis:   62  Text Interpretation: Normal sinus rhythm Confirmed by Yuko Coventry [phone]) on 09/05/2023 6:00:27 PM   CV Studies: Cardiac studies reviewed are outlined and summarized above. Otherwise please see EMR for full report. Cardiac Studies & Procedures   ______________________________________________________________________________________________     ECHOCARDIOGRAM  ECHOCARDIOGRAM COMPLETE 01/08/2021  Narrative ECHOCARDIOGRAM REPORT    Patient Name:   Harry Anderson Date of Exam: 01/08/2021 Medical Rec #:  [id number]        Height:       71.0 in Accession #:    [id number]       Weight:       209.2 lb Date of Birth:  [birth year]       BSA:          2.149 m Patient Age:    29 years         BP:           122/76 mmHg Patient Gender: M                HR:           75 bpm. Exam Location:  Outpatient  Procedure: 2D Echo, Cardiac Doppler, Color Doppler and Strain Analysis  Indications:    R06.9 DOE  History:        Patient has no prior history of Echocardiogram examinations. Arrythmias:SVT, Signs/Symptoms:Dyspnea; Risk Factors:Non-Smoker. Patient denies chest pain and leg edema. He does have  DOE with a history of contracting Covid-19 virus in 08/2020. Mother had aortic valve and root replacement.  Sonographer:    Annabella Cater RVT, RDCS (AE), RDMS Referring Phys: [phone] [facility]   Sonographer Comments: Image acquisition challenging due to respiratory motion. IMPRESSIONS   1. Left ventricular ejection fraction, by estimation, is 60 to 65%. The left ventricle has normal function. The left ventricle has no regional wall motion abnormalities. Left ventricular diastolic parameters were normal. 2. Right ventricular systolic function is normal. The right ventricular size is normal. 3. The mitral valve is normal in structure. Trivial mitral valve regurgitation. 4. Aortic valve regurgitation is not visualized. Aortic valve sclerosis is present, with no evidence of aortic valve stenosis. 5. Aortic dilatation noted. There is mild dilatation of the aortic root, measuring 40 mm.  There is mild dilatation of the ascending aorta, measuring 38 mm. 6. The inferior vena cava is normal in size with greater than 50% respiratory variability, suggesting right atrial pressure of 3 mmHg.  FINDINGS Left Ventricle: Left ventricular ejection fraction, by estimation, is 60 to 65%. The left ventricle has normal function. The left ventricle has no regional wall motion abnormalities. The left ventricular internal cavity size was normal in size. There is no left ventricular hypertrophy. Left ventricular diastolic parameters were normal.  Right Ventricle: The right ventricular size is normal. Right vetricular wall thickness was not assessed. Right ventricular systolic function is normal.  Left Atrium: Left atrial size was normal in size.  Right Atrium: Right atrial size was normal in size.  Pericardium: There is no evidence of pericardial effusion.  Mitral Valve: The mitral valve is normal in structure. There is mild thickening of the mitral valve leaflet(s). Trivial mitral valve  regurgitation.  Tricuspid Valve: The tricuspid valve is normal in structure. Tricuspid valve regurgitation is trivial.  Aortic Valve: Aortic valve regurgitation is not visualized. Aortic valve sclerosis is present, with no evidence of aortic valve stenosis. Aortic valve mean gradient measures 2.0 mmHg. Aortic valve peak gradient measures 4.2 mmHg. Aortic valve area, by VTI measures 3.21 cm.  Pulmonic Valve: The pulmonic valve was normal in structure. Pulmonic valve regurgitation is trivial.  Aorta: Aortic dilatation noted. There is mild dilatation of the aortic root, measuring 40 mm. There is mild dilatation of the ascending aorta, measuring 38 mm.  Venous: The inferior vena cava is normal in size with greater than 50% respiratory variability, suggesting right atrial pressure of 3 mmHg.  IAS/Shunts: No atrial level shunt detected by color flow Doppler.   LEFT VENTRICLE PLAX 2D LVIDd:         4.42 cm     Diastology LVIDs:         3.01 cm     LV e' medial:    7.94 cm/s LV PW:         1.12 cm     LV E/e' medial:  7.8 LV IVS:        0.95 cm     LV e' lateral:   9.03 cm/s LVOT diam:     2.30 cm     LV E/e' lateral: 6.8 LV SV:         58 LV SV Index:   27 LVOT Area:     4.15 cm  3D Volume EF: LV Volumes (MOD)           3D EF:        59 % LV vol d, MOD A2C: 77.4 ml LV EDV:       127 ml LV vol d, MOD A4C: 78.0 ml LV ESV:       52 ml LV vol s, MOD A2C: 22.8 ml LV SV:        75 ml LV vol s, MOD A4C: 24.8 ml LV SV MOD A2C:     54.6 ml LV SV MOD A4C:     78.0 ml LV SV MOD BP:      53.7 ml  RIGHT VENTRICLE RV S prime:     9.41 cm/s TAPSE (M-mode): 1.8 cm  LEFT ATRIUM             Index        RIGHT ATRIUM           Index LA diam:  3.40 cm 1.58 cm/m   RA Area:     15.40 cm LA Vol (A2C):   54.0 ml 25.13 ml/m  RA Volume:   38.00 ml  17.68 ml/m LA Vol (A4C):   47.7 ml 22.19 ml/m LA Biplane Vol: 50.7 ml 23.59 ml/m AORTIC VALVE                    PULMONIC VALVE AV Area  (Vmax):    3.15 cm     PV Vmax:          0.80 m/s AV Area (Vmean):   3.44 cm     PV Peak grad:     2.6 mmHg AV Area (VTI):     3.21 cm     PR End Diast Vel: 2.32 msec AV Vmax:           103.00 cm/s AV Vmean:          68.300 cm/s AV VTI:            0.180 m AV Peak Grad:      4.2 mmHg AV Mean Grad:      2.0 mmHg LVOT Vmax:         78.20 cm/s LVOT Vmean:        56.600 cm/s LVOT VTI:          0.139 m LVOT/AV VTI ratio: 0.77  AORTA Ao Root diam: 3.90 cm Ao Asc diam:  3.80 cm Ao Arch diam: 2.8 cm  MITRAL VALVE               TRICUSPID VALVE MV Area (PHT): 3.81 cm    TR Peak grad:   16.8 mmHg MV Decel Time: 199 msec    TR Vmax:        205.00 cm/s MV E velocity: 61.60 cm/s MV A velocity: 55.40 cm/s  SHUNTS MV E/A ratio:  1.11        Systemic VTI:  0.14 m Systemic Diam: 2.30 cm  Vina Gull MD Electronically signed by Vina Gull MD Signature Date/Time: 01/08/2021/4:32:58 PM    Final    MONITORS  LONG TERM MONITOR (3-14 DAYS) 11/12/2022  Narrative 9 triggered events for sinus rhythm. No significant tachyarrhythmia or bradyarrhythmia. No atrial fibrillation or flutter.   Patch Wear Time:  7 days and 0 hours (2024-10-01T21:27:07-398 to 2024-10-08T21:27:36-0400)  Patient had a min HR of 43 bpm, max HR of 143 bpm, and avg HR of 73 bpm. Predominant underlying rhythm was Sinus Rhythm. Isolated SVEs were rare (<1.0%), and no SVE Couplets or SVE Triplets were present. Isolated VEs were rare (<1.0%), and no VE Couplets or VE Triplets were present. Ventricular Trigeminy was present.       ______________________________________________________________________________________________       Current Reported Medications:.    Current Meds  Medication Sig   buPROPion  (WELLBUTRIN  XL) 150 MG 24 hr tablet Take 1 tablet (150 mg total) by mouth every morning.   lisdexamfetamine (VYVANSE ) 30 MG chewable tablet Chew 1 tablet (30 mg total) by mouth 2 (two) times daily.   lisdexamfetamine  (VYVANSE ) 60 MG capsule Take 1 capsule (60 mg total) by mouth daily.   metoprolol  tartrate (LOPRESSOR ) 25 MG tablet Take 1 tablet (25 mg total) by mouth 2 (two) times daily.   ondansetron  (ZOFRAN ) 4 MG tablet Take 1 tablet (4 mg total) by mouth every 8 (eight) hours as needed for nausea or vomiting.   oseltamivir  (TAMIFLU ) 75 MG capsule Take 1 capsule (75 mg  total) by mouth 2 (two) times daily for 5 days.   Physical Exam:    VS:  BP 116/74   Pulse 80   Ht 5' 11 (1.803 m)   Wt 185 lb 12.8 oz (84.3 kg)   SpO2 96%   BMI 25.91 kg/m    Wt Readings from Last 3 Encounters:  09/05/23 185 lb 12.8 oz (84.3 kg)  10/25/22 184 lb 9.6 oz (83.7 kg)  06/05/22 188 lb (85.3 kg)    GEN: Well nourished, well developed in no acute distress NECK: No JVD; No carotid bruits CARDIAC: RRR, no murmurs, rubs, gallops RESPIRATORY:  Clear to auscultation without rales, wheezing or rhonchi  ABDOMEN: Soft, non-tender, non-distended EXTREMITIES:  No edema; No acute deformity     Asessement and Plan:.    Chest pain: Patient with episode of chest pain earlier this week, was worked up in the emergency department with reassuring results.  However given chest pain and increased palpitations this week patient is agreeable to completing exercise tolerance test for further evaluation.  Patient will hold his metoprolol  prior to testing.  Reviewed ED precautions. Informed Consent   Shared Decision Making/Informed Consent The risks [chest pain, shortness of breath, cardiac arrhythmias, dizziness, blood pressure fluctuations, myocardial infarction, stroke/transient ischemic attack, and life-threatening complications (estimated to be 1 in 10,000)], benefits (risk stratification, diagnosing coronary artery disease, treatment guidance) and alternatives of an exercise tolerance test were discussed in detail with Mr. Lothrop and he agrees to proceed.     Palpitations/SVT: Per notes patient previously had episodes of SVT on Holter  monitor in 2012/2013.  His prior episode in 2012 was consistent with vasovagal syncope. Cardiac monitor in 10/2022 indicated an average heart rate of 73 bpm, ranging from 43 to 143 bpm.  Predominant underlying rhythm was sinus rhythm.  There were 9 triggered events associated with sinus rhythm.  Patient was previous started on metoprolol  tartrate 25 mg twice daily, per reports today has not been regularly taking.  Patient reports an episode of palpitation earlier this week as he was putting his daughter around 4 AM, describes fast and harder heartbeats, noted have felt different from his prior SVT.  Patient reports that he had not taken his metoprolol  at night.  He will continue to monitor with his cardia mobile, unfortunately was unable to capture that night.  If he continues to have persistent palpitations even with the initiation of metoprolol  consider repeat cardiac monitor.  Continue metoprolol  tartrate 25 mg twice daily.   Disposition: F/u with Husayn Reim, NP in two months   Signed, Krosby Ritchie D Dineen Conradt, NP

## 2023-09-05 ENCOUNTER — Telehealth: Payer: Self-pay

## 2023-09-05 ENCOUNTER — Encounter: Payer: Self-pay | Admitting: Cardiology

## 2023-09-05 ENCOUNTER — Ambulatory Visit: Attending: Cardiology | Admitting: Cardiology

## 2023-09-05 ENCOUNTER — Other Ambulatory Visit (HOSPITAL_COMMUNITY): Payer: Self-pay

## 2023-09-05 VITALS — BP 116/74 | HR 80 | Ht 71.0 in | Wt 185.8 lb

## 2023-09-05 DIAGNOSIS — I471 Supraventricular tachycardia, unspecified: Secondary | ICD-10-CM

## 2023-09-05 DIAGNOSIS — R0789 Other chest pain: Secondary | ICD-10-CM

## 2023-09-05 NOTE — Telephone Encounter (Signed)
 error

## 2023-09-05 NOTE — Patient Instructions (Signed)
 Medication Instructions:  No changes *If you need a refill on your cardiac medications before your next appointment, please call your pharmacy*  Lab Work: No labs  Testing/Procedures: Your physician has requested that you have an Exercise Stress Test. An exercise tolerance test is a test to check how your heart works during exercise. You will need to walk on a treadmill for this test. An electrocardiogram (ECG) will record your heartbeat when you are at rest and when you are exercising.    Please arrive 15 minutes prior to your appointment time for registration and insurance purposes.   The test will take approximately 45 minutes to complete.   How to prepare for your Exercise Stress Test: Do bring a list of your current medications with you.  If not listed below, you may take your medications as normal. Do wear comfortable clothes (no dresses or overalls) and walking shoes, tennis shoes preferred (no heels or open toed shoes are allowed) Do Not wear cologne, perfume, aftershave or lotions (deodorant is allowed). Do not drink or eat foods with caffeine for 24 hours before the test. (Chocolate, coffee, tea, or energy drinks) If you use an inhaler, bring it with you to the test. Do not smoke for 4 hours before the test.    If these instructions are not followed, your test will have to be rescheduled.   If you cannot keep your appointment, please provide 24 hours notification to our office, to avoid a possible $50 charge to your account.  Follow-Up: At [facility], you and your health needs are our priority.  As part of our continuing mission to provide you with exceptional heart care, our providers are all part of one team.  This team includes your primary Cardiologist (physician) and Advanced Practice Providers or APPs (Physician Assistants and Nurse Practitioners) who all work together to provide you with the care you need, when you need it.  Your next appointment:   2  month(s)  Provider:   Katlyn West, NP   We recommend signing up for the patient portal called MyChart.  Sign up information is provided on this After Visit Summary.  MyChart is used to connect with patients for Virtual Visits (Telemedicine).  Patients are able to view lab/test results, encounter notes, upcoming appointments, etc.  Non-urgent messages can be sent to your provider as well.   To learn more about what you can do with MyChart, go to [email or web address].

## 2023-09-11 ENCOUNTER — Encounter (HOSPITAL_COMMUNITY): Payer: Self-pay | Admitting: *Deleted

## 2023-09-22 IMAGING — CT CTA CHEST W/ AND/OR W/O CM W/ OR W/O DISSECTION AND GATING
1 of 8 series · 3 of 16 positions shown, 4 images · IV contrast (APPLIED)
Comparison: Echo report 01/08/2021

CLINICAL DATA: Aortic root dilatation by echo 01/08/2021

EXAM:
CT ANGIOGRAPHY CHEST WITH CONTRAST
TECHNIQUE: Multidetector CT imaging of the chest was performed using the
standard protocol during bolus administration of intravenous
contrast. Multiplanar CT image reconstructions and MIPs were
obtained to evaluate the vascular anatomy.

[Series 9: arterial thins · axial · arterial · 0.67mm/px · z∈[-274,-146]mm · 3 of 427 slices shown, 4 images]
[im 107/427  soft-tissue]
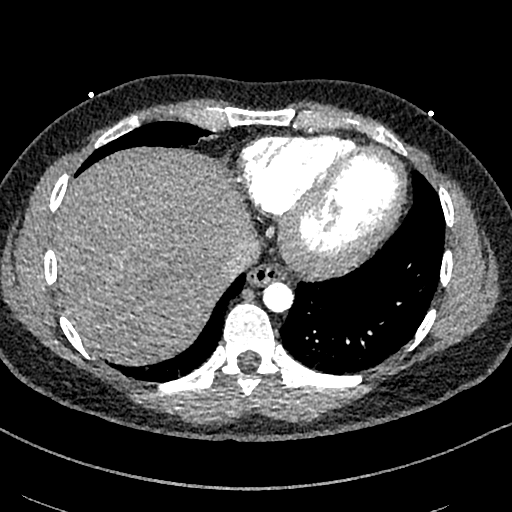
[im 107/427  bone]
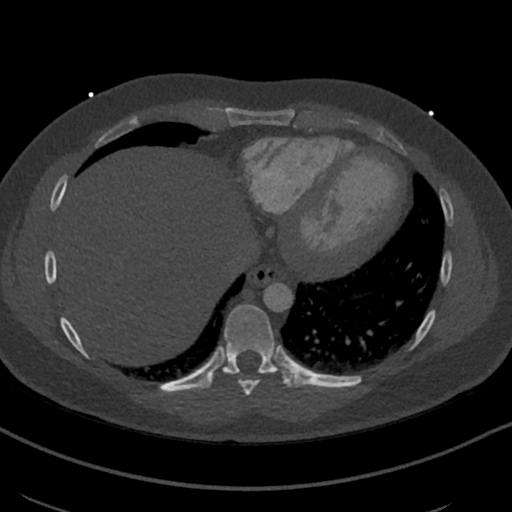
[im 214/427  soft-tissue]
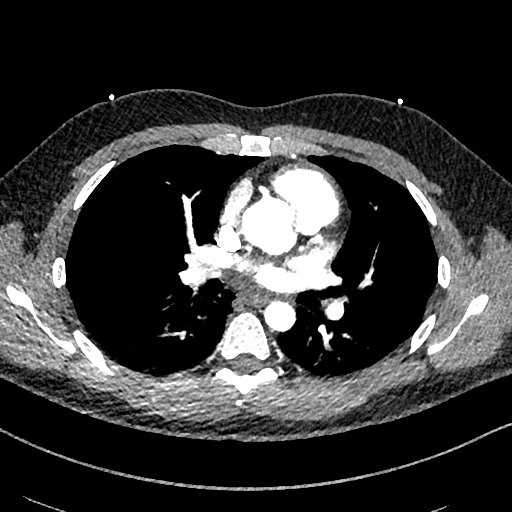
[im 320/427  soft-tissue]
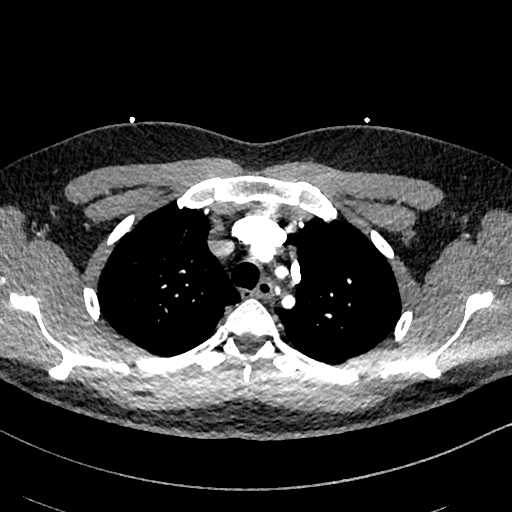

[3 of 16 positions shown; findings below may reference images not displayed]

RADIATION DOSE REDUCTION: This exam was performed according to the
departmental dose-optimization program which includes automated
exposure control, adjustment of the mA and/or kV according to
patient size and/or use of iterative reconstruction technique.

CONTRAST:  100mL OMNIPAQUE IOHEXOL 350 MG/ML SOLN
FINDINGS: Cardiovascular: Intact thoracic aorta. No significant aneurysm or
dissection. No mediastinal hemorrhage or hematoma. Patent 3 vessel
arch anatomy. Slight prominence of the aortic root, sinus of
Valsalva measures 37 mm. Remainder of the aorta is normal in
caliber.

Sinotubular junction 27 mm

Ascending aortic diameter 27 mm

Visualized pulmonary arteries are normal in caliber and patent. No
significant filling defect or pulmonary embolus by CTA.

Central venous structures are patent.  No Aarons process.

Normal heart size.  No pericardial effusion.

Mediastinum/Nodes: No enlarged mediastinal, hilar, or axillary lymph
nodes. Thyroid gland, trachea, and esophagus demonstrate no
significant findings.

Lungs/Pleura: Lungs are clear. No pleural effusion or pneumothorax.

Upper Abdomen: No acute abnormality.

Musculoskeletal: No chest wall abnormality. No acute or significant
osseous findings.

Review of the MIP images confirms the above findings.
IMPRESSION: Normal caliber thoracic aorta with slight prominence of the sinus of
Valsalva measuring 37 mm. Negative for aneurysm and dissection.

Negative for acute pulmonary embolus

No other acute intrathoracic nonvascular finding.

## 2023-09-24 NOTE — Addendum Note (Signed)
 Addended by: Reinaldo Helt on: 09/24/2023 10:20 AM   Modules accepted: Orders

## 2023-09-25 ENCOUNTER — Ambulatory Visit (HOSPITAL_COMMUNITY)
Admission: RE | Admit: 2023-09-25 | Discharge: 2023-09-25 | Disposition: A | Discharge status: Home / Self Care | Source: Ambulatory Visit | Attending: Cardiology | Admitting: Cardiology

## 2023-09-25 ENCOUNTER — Ambulatory Visit: Payer: Self-pay | Admitting: Cardiology

## 2023-09-25 DIAGNOSIS — I517 Cardiomegaly: Secondary | ICD-10-CM

## 2023-09-25 DIAGNOSIS — R0789 Other chest pain: Secondary | ICD-10-CM | POA: Diagnosis not present

## 2023-09-25 DIAGNOSIS — R072 Precordial pain: Secondary | ICD-10-CM

## 2023-09-25 DIAGNOSIS — I471 Supraventricular tachycardia, unspecified: Secondary | ICD-10-CM | POA: Insufficient documentation

## 2023-09-25 LAB — EXERCISE TOLERANCE TEST
Angina Index: 0
Estimated workload: 11.7
Exercise duration (min): 10 min
Exercise duration (sec): 0 s
MPHR: 186 {beats}/min
Peak HR: 187 {beats}/min
Percent HR: 100 %
Rest HR: 99 {beats}/min

## 2023-09-26 NOTE — Telephone Encounter (Signed)
-----   Message from Nurse Federico MATSU sent at 09/26/2023 11:12 AM EDT -----  ----- Message ----- From: Leonel Deeanna RAMAN Sent: 09/26/2023   9:43 AM EDT To: [facility] Triage  ----- Message from Deeanna RAMAN Leonel sent at 09/26/2023  9:43 AM EDT -----

## 2023-09-26 NOTE — Telephone Encounter (Signed)
-----   Message from Katlyn D [place] sent at 09/26/2023  1:26 PM EDT ----- Called and discussed results with Mr. List, he is agreeable to coronary CTA given prior precordial pain and inconclusive ETT.  Patient will need a BMET prior to imaging.  Also recommended patient  have echocardiogram for LVH.  Patient in agreement to further testing, request for scheduling to have test be on the same day if possible.  He reports that his palpitations have resolved with  restarting of metoprolol  tartrate 25 mg twice daily, reports that his heart rate sits at 65 bpm.  Please order coronary CTA, BMET  and echocardiogram, please ensure prior auth is obtained prior to  testing. Thank you.  ----- Message ----- From: Raford Riggs, MD Sent: 09/25/2023   5:53 PM EDT To: Katlyn D West, NP

## 2023-09-26 NOTE — Telephone Encounter (Signed)
 Patient is returning call.

## 2023-09-26 NOTE — Telephone Encounter (Addendum)
   Your cardiac CT will be scheduled at one of the below locations:   [facility] [address] [place], [place] [address] [phone]  OR   [facility] [address]  [place], [place] [address] [phone]  If scheduled at [name], please arrive at the [facility] and Children's Entrance (Entrance C2) of [facility] 30 minutes prior to test start time. You can use the FREE valet parking offered at entrance C (encouraged to control the heart rate for the test)  Proceed to the [name] Radiology Department (first floor) to check-in and test prep.  All radiology patients and guests should use entrance C2 at [facility], accessed from [facility], even though the hospital's physical address listed is [address]  If scheduled at the [name], please enter the parking lot using the [address] entrance and use the FREE valet service at the patient drop-off area. Enter the building and check-in with registration on the main floor.  Please follow these instructions carefully (unless otherwise directed):  An IV will be required for this test and Nitroglycerin will be given.   On the Night Before the Test: Be sure to Drink plenty of water. Do not consume any caffeinated/decaffeinated beverages or chocolate 12 hours prior to your test. Do not take any antihistamines 12 hours prior to your test.  On the Day of the Test: Drink plenty of water until 1 hour prior to the test. Do not eat any food 1 hour prior to test. You may take your regular medications prior to the test.  Take Metoprolol  tartrate 50 mg PO 90-120 minutes prior to scan         After the Test: Drink plenty of water. After receiving IV contrast, you may experience a mild flushed feeling. This is normal. On occasion, you may experience a mild rash up to 24 hours after the test. This is not  dangerous. If this occurs, you can take Benadryl 25 mg, Zyrtec, Claritin, or Allegra and increase your fluid intake.  If you experience trouble breathing, this can be serious. If it is severe call 911 IMMEDIATELY. If it is mild, please call our office.  We will call to schedule your test 2-4 weeks out understanding that some insurance companies will need an authorization prior to the service being performed.   For more information and frequently asked questions, please visit our website : [email or web address]  For non-scheduling related questions, please contact the cardiac imaging nurse navigator should you have any questions/concerns: Cardiac Imaging Nurse Navigators Direct Office Dial: [phone]   For scheduling needs, including cancellations and rescheduling, please call [place], [phone].

## 2023-10-03 ENCOUNTER — Encounter (HOSPITAL_COMMUNITY): Payer: Self-pay

## 2023-10-05 DIAGNOSIS — R0789 Other chest pain: Secondary | ICD-10-CM

## 2023-10-07 ENCOUNTER — Ambulatory Visit (HOSPITAL_COMMUNITY)

## 2023-10-07 DIAGNOSIS — R0789 Other chest pain: Secondary | ICD-10-CM | POA: Diagnosis not present

## 2023-10-08 ENCOUNTER — Ambulatory Visit: Payer: Self-pay | Admitting: Cardiology

## 2023-10-08 LAB — BASIC METABOLIC PANEL WITH GFR
BUN/Creatinine Ratio: 14 (ref 9–20)
BUN: 12 mg/dL (ref 6–20)
CO2: 23 mmol/L (ref 20–29)
Calcium: 9.2 mg/dL (ref 8.7–10.2)
Chloride: 104 mmol/L (ref 96–106)
Creatinine, Ser: 0.84 mg/dL (ref 0.76–1.27)
Glucose: 113 mg/dL — ABNORMAL HIGH (ref 70–99)
Potassium: 3.9 mmol/L (ref 3.5–5.2)
Sodium: 142 mmol/L (ref 134–144)
eGFR: 117 mL/min/1.73 (ref >59)

## 2023-10-09 ENCOUNTER — Ambulatory Visit (HOSPITAL_COMMUNITY)
Admission: RE | Admit: 2023-10-09 | Discharge: 2023-10-09 | Disposition: A | Discharge status: Home / Self Care | Source: Ambulatory Visit | Attending: Cardiology | Admitting: Cardiology

## 2023-10-09 DIAGNOSIS — R072 Precordial pain: Secondary | ICD-10-CM | POA: Diagnosis not present

## 2023-10-09 MED ORDER — NITROGLYCERIN 0.4 MG SL SUBL
0.8000 mg | SUBLINGUAL_TABLET | Freq: Once | SUBLINGUAL | Status: AC
  Administered 2023-10-09: 0.8 mg via SUBLINGUAL

## 2023-10-09 MED ORDER — IOHEXOL 350 MG/ML SOLN
100.0000 mL | Freq: Once | INTRAVENOUS | Status: AC | PRN
  Administered 2023-10-09: 100 mL via INTRAVENOUS

## 2023-10-13 ENCOUNTER — Ambulatory Visit: Payer: Self-pay | Admitting: Cardiology

## 2023-10-28 ENCOUNTER — Other Ambulatory Visit (HOSPITAL_COMMUNITY): Payer: Self-pay

## 2023-10-28 ENCOUNTER — Other Ambulatory Visit: Payer: Self-pay

## 2023-10-28 ENCOUNTER — Other Ambulatory Visit (HOSPITAL_BASED_OUTPATIENT_CLINIC_OR_DEPARTMENT_OTHER): Payer: Self-pay

## 2023-10-28 MED ORDER — METOPROLOL TARTRATE 25 MG PO TABS
25.0000 mg | ORAL_TABLET | Freq: Two times a day (BID) | ORAL | 3 refills | Status: DC
  Filled 2023-10-28: qty 60, 30d supply, fill #0

## 2023-10-31 ENCOUNTER — Ambulatory Visit (HOSPITAL_COMMUNITY)
Admission: RE | Admit: 2023-10-31 | Discharge: 2023-10-31 | Disposition: A | Discharge status: Home / Self Care | Source: Ambulatory Visit | Attending: Cardiology | Admitting: Cardiology

## 2023-10-31 DIAGNOSIS — I517 Cardiomegaly: Secondary | ICD-10-CM | POA: Insufficient documentation

## 2023-10-31 LAB — ECHOCARDIOGRAM COMPLETE
Area-P 1/2: 3.47 cm2
S' Lateral: 3.2 cm

## 2023-11-05 NOTE — Progress Notes (Unsigned)
 Cardiology Office Note    Date:  11/06/2023  ID:  Harry Anderson, DOB 1990-02-12, MRN [id number] PCP:  Regino Slater, MD  Cardiologist:  Alvan Ronal BRAVO, MD (Inactive)  Electrophysiologist:  None   Chief Complaint: Follow up for palpitations   History of Present Illness: .    Harry Anderson is a 33 y.o. male with visit-pertinent history of anxiety, ADHD, SVT.   Per notes patient has history of syncopal episode in 2012.  Patient woke with bowel pain and sudden urge to defecate and noted palpitations and hot flash.  He reported passing out and losing consciousness.  Patient had a Holter monitor read baseline rhythm as sinus with sinus bradycardia as well as episode of SVT.  He was prescribed metoprolol  XL 25 mg daily.  While in [place] in 2012 he was instructed to stop metoprolol .  From 20 15-20 16 he saw a cardiologist in [place], he had a repeat 4-week monitor which noted runs of sinus tachycardia in 08/2014.  Patient had heart rates up to 157 noted to sinus tachycardia.  He was restarted on metoprolol  12.5 mg XL, per notes had a bubble study in [place] however there is Anderson records of his echocardiogram.   Patient establish care with Dr. Ronal Alvan in 11/2020.  Echocardiogram at that time indicated LVEF 60 to 65%, Anderson RWMA, diastolic parameters were normal, RV systolic function and size was normal, trivial mitral valve regurgitation, aortic valve sclerosis was present without evidence of stenosis.  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  Patient's cardiac monitor indicated an average heart rate of 89 bpm, ranging from 49 to 187 bpm.  Predominant underlying rhythm was sinus rhythm.  He had 2 runs of ventricular tachycardia, run with the fastest interval lasting 7 beats with a max rate of 158 bpm, run with the fastest interval was also the longest.  Ventricular tachycardia was detected within 45 seconds of symptomatic  patient's events.  Patient's metoprolol  was continued.   Patient was last seen in clinic by Dr. Alvan on 10/25/2022.  Per notes patient had restarted on Adderall, had noted increased heart rates and his metoprolol  was increased to 25 mg daily.  It was noted that patient was also on Vyvanse  which was likely contributing to symptoms.  Patient's metoprolol  was transitioned to 25 mg tartrate twice daily.  Cardiac monitor in 10/2022 indicated an average heart rate of 73 bpm, ranging from 43 to 143 bpm.  Predominant underlying rhythm was sinus rhythm.  There were 9 triggered events associated with sinus rhythm.   On 08/31/2023 patient presented to the emergency department with chest pain and palpitations.  EKG was without ischemic changes, Anderson evidence of SVT.  Patient's high sensitive troponin was negative, D-dimer was normal.  Patient was discharged with recommendation to follow-up with PCP.  Patient was last in the clinic on 09/05/2023 for hospital follow-up.  Patient reported that he had gone to the emergency room after he lifted his young daughter out of her bassinet, then experienced left-sided chest pain that rated up into his shoulder.  Patient also noted concern for increasing heart rate when he was up and sitting with his daughter with associated shortness of breath.  He was unable to capture episode on [employer] mobile.  He reported that he had not been really taking his metoprolol  consistently, encouraged patient to take his metoprolol  tartrate 25 mg twice daily.  ETT was  ordered for further evaluation.  Patient had a baseline T wave inversion and LVH with repolarization abnormalities, Anderson arrhythmias were noted with ETT however coronary CTA was recommended.  Coronary CTA on 10/09/2023 showed a coronary calcium score of 1, minimal nonobstructive CAD.  Echocardiogram on 10/31/2023 indicated LVEF 60 to 65%, Anderson RWMA, diastolic primaries were normal, RV systolic function and size was normal, Anderson evidence of mitral valve  regurgitation or stenosis, aortic valve regurgitation was not visualized, Anderson stenosis present.  Today he presents for follow-up.  He reports that he has been doing well overall. He notes one episode of chest discomfort that moved up his chest into his throat, reports resolved quickly.  He denies any further chest discomfort.  He reports that his palpitations have significantly improved with taking  metoprolol  twice a day.  He denies any increased shortness of breath, lower extremity edema, orthopnea or PND.  He denies any presyncope or syncope.  Patient notes significant family history: Patient reports that a few months ago his father went into cardiac arrest while at work, per his report was in [place] and is now with an ICD.  Per patient workup was unrevealing including Anderson evidence of MI, felt to possibly be related to low potassium levels.  His mother had an aortic valve and root replacement also has history of atrial fibrillation s/p PPM.  Patient sister has POTS and Ehrler's Danlos.  ROS: .   Today he denies chest pain, shortness of breath, lower extremity edema, fatigue, palpitations, melena, hematuria, hemoptysis, diaphoresis, weakness, presyncope, syncope, orthopnea, and PND.  All other systems are reviewed and otherwise negative. Studies Reviewed: SABRA   EKG:  EKG is not ordered today.  CV Studies: Cardiac studies reviewed are outlined and summarized above. Otherwise please see EMR for full report. Cardiac Studies & Procedures   ______________________________________________________________________________________________   STRESS TESTS  EXERCISE TOLERANCE TEST (ETT) 09/25/2023  Interpretation Summary   Horizontal ST depression in the inferolateral leads (II, III, aVF, V5 and V6) was noted.   Given the baseline T wave inversion and LVH with repolarization abnormalities, the stress induced EKG changes are less specific.   Consider further evaluation with coronary CT-A or perfusion imaging.    ECHOCARDIOGRAM  ECHOCARDIOGRAM COMPLETE 10/31/2023  Narrative ECHOCARDIOGRAM REPORT    Patient Name:   Harry Anderson Date of Exam: 10/31/2023 Medical Rec #:  [id number]        Height:       71.0 in Accession #:    [id number]       Weight:       185.8 lb Date of Birth:  [birth year]       BSA:          2.044 m Patient Age:    32 years         BP:           116/74 mmHg Patient Gender: M                HR:           74 bpm. Exam Location:  [address]  Procedure: 2D Echo, 3D Echo, Cardiac Doppler, Color Doppler and Strain Analysis (Both Spectral and Color Flow Doppler were utilized during procedure).  Indications:    I51.7 LVH  History:        Patient has prior history of Echocardiogram examinations, most recent 01/08/2021. SVT. Anxiety.  Sonographer:    Carl Coma RDCS Referring Phys: [id number] Latavius Capizzi D Aprill Banko  IMPRESSIONS  1. Left ventricular ejection fraction, by estimation, is 60 to 65%. Left ventricular ejection fraction by 3D volume is 60 %. The left ventricle has normal function. The left ventricle has Anderson regional wall motion abnormalities. Left ventricular diastolic parameters were normal. The average left ventricular global longitudinal strain is -27.5 %. The global longitudinal strain is normal. 2. Right ventricular systolic function is normal. The right ventricular size is normal. 3. The mitral valve is normal in structure. Anderson evidence of mitral valve regurgitation. Anderson evidence of mitral stenosis. 4. The aortic valve is tricuspid. Aortic valve regurgitation is not visualized. Anderson aortic stenosis is present. 5. The inferior vena cava is normal in size with greater than 50% respiratory variability, suggesting right atrial pressure of 3 mmHg.  FINDINGS Left Ventricle: Left ventricular ejection fraction, by estimation, is 60 to 65%. Left ventricular ejection fraction by 3D volume is 60 %. The left ventricle has normal function. The left ventricle has Anderson regional  wall motion abnormalities. The average left ventricular global longitudinal strain is -27.5 %. Strain was performed and the global longitudinal strain is normal. The left ventricular internal cavity size was normal in size. There is Anderson left ventricular hypertrophy. Left ventricular diastolic parameters were normal.  Right Ventricle: The right ventricular size is normal. Anderson increase in right ventricular wall thickness. Right ventricular systolic function is normal.  Left Atrium: Left atrial size was normal in size.  Right Atrium: Right atrial size was normal in size.  Pericardium: There is Anderson evidence of pericardial effusion.  Mitral Valve: The mitral valve is normal in structure. Anderson evidence of mitral valve regurgitation. Anderson evidence of mitral valve stenosis.  Tricuspid Valve: The tricuspid valve is normal in structure. Tricuspid valve regurgitation is not demonstrated. Anderson evidence of tricuspid stenosis.  Aortic Valve: The aortic valve is tricuspid. Aortic valve regurgitation is not visualized. Anderson aortic stenosis is present.  Pulmonic Valve: The pulmonic valve was normal in structure. Pulmonic valve regurgitation is trivial. Anderson evidence of pulmonic stenosis.  Aorta: The aortic root is normal in size and structure.  Venous: The inferior vena cava is normal in size with greater than 50% respiratory variability, suggesting right atrial pressure of 3 mmHg.  IAS/Shunts: Anderson atrial level shunt detected by color flow Doppler.  Additional Comments: 3D was performed not requiring image post processing on an independent workstation and was normal.   LEFT VENTRICLE PLAX 2D LVIDd:         4.50 cm         Diastology LVIDs:         3.20 cm         LV e' medial:    8.43 cm/s LV PW:         1.00 cm         LV E/e' medial:  7.8 LV IVS:        0.80 cm         LV e' lateral:   12.43 cm/s LVOT diam:     2.10 cm         LV E/e' lateral: 5.3 LV SV:         74 LV SV Index:   36              2D  Longitudinal LVOT Area:     3.46 cm        Strain 2D Strain GLS   -28.4 % (A4C): 2D Strain GLS   -26.0 % (A3C): 2D Strain GLS   -  27.9 % (A2C): 2D Strain GLS   -27.5 % Avg:  3D Volume EF LV 3D EF:    Left ventricul ar ejection fraction by 3D volume is 60 %.  3D Volume EF: 3D EF:        60 % LV EDV:       153 ml LV ESV:       62 ml LV SV:        91 ml  RIGHT VENTRICLE             IVC RV Basal diam:  3.50 cm     IVC diam: 1.30 cm RV S prime:     12.20 cm/s TAPSE (M-mode): 2.5 cm  LEFT ATRIUM             Index        RIGHT ATRIUM           Index LA diam:        3.50 cm 1.71 cm/m   RA Area:     10.90 cm LA Vol (A2C):   36.9 ml 18.06 ml/m  RA Volume:   24.70 ml  12.09 ml/m LA Vol (A4C):   43.8 ml 21.43 ml/m LA Biplane Vol: 40.2 ml 19.67 ml/m AORTIC VALVE LVOT Vmax:   111.67 cm/s LVOT Vmean:  73.467 cm/s LVOT VTI:    0.213 m  AORTA Ao Root diam: 3.50 cm Ao Asc diam:  2.70 cm  MITRAL VALVE MV Area (PHT): 3.47 cm    SHUNTS MV Decel Time: 219 msec    Systemic VTI:  0.21 m MV E velocity: 65.45 cm/s  Systemic Diam: 2.10 cm MV A velocity: 56.60 cm/s MV E/A ratio:  1.16  Morene Brownie Electronically signed by Morene Brownie Signature Date/Time: 10/31/2023/1:29:54 PM    Final    MONITORS  LONG TERM MONITOR (3-14 DAYS) 11/12/2022  Narrative 9 triggered events for sinus rhythm. Anderson significant tachyarrhythmia or bradyarrhythmia. Anderson atrial fibrillation or flutter.   Patch Wear Time:  7 days and 0 hours (2024-10-01T21:27:07-398 to 2024-10-08T21:27:36-0400)  Patient had a min HR of 43 bpm, max HR of 143 bpm, and avg HR of 73 bpm. Predominant underlying rhythm was Sinus Rhythm. Isolated SVEs were rare (<1.0%), and Anderson SVE Couplets or SVE Triplets were present. Isolated VEs were rare (<1.0%), and Anderson VE Couplets or VE Triplets were present. Ventricular Trigeminy was present.   CT SCANS  CT CORONARY MORPH W/CTA COR W/SCORE 10/09/2023  Addendum 10/14/2023   5:20 PM ADDENDUM REPORT: 10/14/2023 17:17  EXAM: OVER-READ INTERPRETATION  CT CHEST  The following report is an over-read performed by radiologist Dr. Suzen Dials of [facility] on 10/14/2023. This over-read does not include interpretation of cardiac or coronary anatomy or pathology. The coronary calcium score/coronary CTA interpretation by the cardiologist is attached.  COMPARISON:  April 16, 2021  FINDINGS: Cardiovascular: There are Anderson significant extracardiac vascular findings.  Mediastinum/Nodes: There are Anderson enlarged lymph nodes within the visualized mediastinum.  Lungs/Pleura: There is Anderson pleural effusion. The visualized lungs appear clear.  Upper abdomen: Anderson significant findings in the visualized upper abdomen.  Musculoskeletal/Chest wall: Anderson chest wall mass or suspicious osseous findings within the visualized chest.  IMPRESSION: Anderson significant extracardiac findings within the visualized chest.   Electronically Signed By: Suzen Dials M.D. On: 10/14/2023 17:17  Narrative CLINICAL DATA:  33 Year-old Male  EXAM: Cardiac/Coronary CTA  TECHNIQUE: A non-contrast, gated CT scan was obtained with axial slices of 2.5 mm through the  heart for calcium scoring. Calcium scoring was performed using the Agatston method. A 120 kV prospective, gated, contrast cardiac CT scan was obtained. Gantry rotation speed was 230 msec and collimation was 0.63 mm. Two sublingual nitroglycerin  tablets (0.8 mg) were given. The 3D data set was reconstructed with motion correction for the best systolic or diastolic phase. Images were analyzed on a dedicated workstation using MPR, MIP, and VRT modes. The patient received 95 cc of contrast.  FINDINGS: Coronary Arteries:  Normal coronary origin.  Right dominance.  Coronary Calcium Score: 1 (proximal LAD). Percentile not available due to age.  Left main: The left main is a large caliber vessel with a normal take  off from the left coronary cusp that bifurcates to form a left anterior descending artery and a left circumflex artery. There is Anderson significant plaque.  Left anterior descending artery: The LAD is a large caliber vessel with two diagonal vessels and a large septal perforator. Minimal (1-24%) calcified plaque in the proximal LAD.  Left circumflex artery: The LCX is non-dominant with two obtuse marginal arteries. OM1 Supplies a ramus distribution. There is Anderson significant plaque.  Right coronary artery: The RCA is dominant with normal take off from the right coronary cusp. The RCA terminates as a PDA and right posterolateral branch. There is Anderson significant plaque.  Other non-coronary findings:  Right Atrium: Right atrial size is within normal limits.  Right Ventricle: The right ventricular cavity is within normal limits.  Left Atrium: Left atrial size is normal in size with Anderson left atrial appendage filling defect.  Left Ventricle: The ventricular cavity size is within normal limits.  Interatrial septum: Anderson PFO or ASD.  Main Pulmonary Artery: Normal size of the pulmonary artery.  Pulmonary veins: Normal variant pulmonary venous drainage- right middle pulmonary vein.  Systemic veins: Normal venous drainage.  Pericardium: Normal thickness without significant effusion or calcium present.  Cardiac valves: The aortic valve is tri-leaflet without significant calcification. The mitral valve is normal without significant calcification.  Aorta: Normal caliber without significant calcifications.  Extra-cardiac findings: See attached radiology report for non-cardiac structures.  Artifact: NA  Image quality: Excellent  IMPRESSION: 1. Coronary calcium score of 1.  2. Normal coronary origin with right dominance.  3. CAD-RADS 1. Minimal non-obstructive CAD (1-24%). Consider non-atherosclerotic causes of chest pain. Consider preventive therapy and risk factor  modification.  RECOMMENDATIONS: RECOMMENDATIONS The proposed cut-off value of 1,651 AU yielded a 93 % sensitivity and 75 % specificity in grading AS severity in patients with classical low-flow, low-gradient AS. Proposed different cut-off values to define severe AS for men and women as 2,065 AU and 1,274 AU, respectively. The joint European and American recommendations for the assessment of AS consider the aortic valve calcium score as a continuum - a very high calcium score suggests severe AS and a low calcium score suggests severe AS is unlikely.  Donney VEAR Jarome LULLA Stephen RENETTE, et al. 2017 ESC/EACTS Guidelines for the management of valvular heart disease. Eur Heart J 2017;38:2739-91.  Coronary artery calcium (CAC) score is a strong predictor of incident coronary heart disease (CHD) and provides predictive information beyond traditional risk factors. CAC scoring is reasonable to use in the decision to withhold, postpone, or initiate statin therapy in intermediate-risk or selected borderline-risk asymptomatic adults (age 34-75 years and LDL-C >=70 to <190 mg/dL) who do not have diabetes or established atherosclerotic cardiovascular disease (ASCVD).* In intermediate-risk (10-year ASCVD risk >=7.5% to <20%) adults or selected borderline-risk (10-year ASCVD risk >=5%  to <7.5%) adults in whom a CAC score is measured for the purpose of making a treatment decision the following recommendations have been made:  If CAC = 0, it is reasonable to withhold statin therapy and reassess in 5 to 10 years, as long as higher risk conditions are absent (diabetes mellitus, family history of premature CHD in first degree relatives (males <55 years; females <65 years), cigarette smoking, LDL >=190 mg/dL or other independent risk factors).  If CAC is 1 to 99, it is reasonable to initiate statin therapy for patients >=21 years of age.  If CAC is >=100 or >=75th percentile, it is reasonable to  initiate statin therapy at any age.  Cardiology referral should be considered for patients with CAC scores =400 or >=75th percentile.  *2018 AHA/ACC/AACVPR/AAPA/ABC/ACPM/ADA/AGS/APhA/ASPC/NLA/PCNA Guideline on the Management of Blood Cholesterol: A Report of the American College of Cardiology/American Heart Association Task Force on Clinical Practice Guidelines. J Am Coll Cardiol. 2019;73(24):3168-3209.  Stanly Leavens, MD  Electronically Signed: By: Stanly Leavens M.D. On: 10/10/2023 13:38     ______________________________________________________________________________________________       Current Reported Medications:.    Current Meds  Medication Sig   buPROPion  (WELLBUTRIN  XL) 150 MG 24 hr tablet Take 1 tablet (150 mg total) by mouth every morning.   lisdexamfetamine (VYVANSE ) 30 MG chewable tablet Chew 1 tablet (30 mg total) by mouth 2 (two) times daily.   Lisdexamfetamine Dimesylate  60 MG CHEW Chew 1 tablet (60 mg total) by mouth daily.   meloxicam  (MOBIC ) 7.5 MG tablet Take 1 tablet (7.5 mg total) by mouth daily.   metoprolol  succinate (TOPROL -XL) 25 MG 24 hr tablet Take 1 tablet (25 mg total) by mouth daily.   metoprolol  tartrate (LOPRESSOR ) 25 MG tablet Take 1 tablet (25 mg total) by mouth 2 (two) times daily with a meal.   Physical Exam:    VS:  BP 100/62 (BP Location: Left Arm, Patient Position: Sitting, Cuff Size: Normal)   Pulse 67   Resp 16   Ht 5' 11 (1.803 m)   SpO2 97%   BMI 25.91 kg/m    Wt Readings from Last 3 Encounters:  09/05/23 185 lb 12.8 oz (84.3 kg)  10/25/22 184 lb 9.6 oz (83.7 kg)  06/05/22 188 lb (85.3 kg)    GEN: Well nourished, well developed in Anderson acute distress NECK: Anderson JVD; Anderson carotid bruits CARDIAC: RRR, Anderson murmurs, rubs, gallops RESPIRATORY:  Clear to auscultation without rales, wheezing or rhonchi  ABDOMEN: Soft, non-tender, non-distended EXTREMITIES:  Anderson edema; Anderson acute deformity     Asessement and Plan:.     Chest pain: Coronary CTA on 10/09/2023 showed a coronary calcium score of 1, minimal nonobstructive CAD.  Patient reports only 1 other episode of chest discomfort that quickly resolved.  Denies any recurrence. Heart healthy diet and regular cardiovascular exercise encouraged.  Reviewed ED precautions.  Continue metoprolol  to tartrate 25 mg twice daily.  Palpitations: Per notes patient previously had episodes of SVT on Holter monitor in 2012/2013. His prior episode in 2012 was consistent with vasovagal syncope. Cardiac monitor in 10/2022 indicated an average heart rate of 73 bpm, ranging from 43 to 143 bpm. Predominant underlying rhythm was sinus rhythm. There were 9 triggered events associated with sinus rhythm.  Patient reports that his palpitations have resolved with metoprolol  tartrate 25 mg twice daily.   Disposition: F/u with Dr. Floretta in one year to establish care.   Signed, Yaacov Koziol D Ignazio Kincaid, NP

## 2023-11-06 ENCOUNTER — Encounter: Payer: Self-pay | Admitting: Cardiology

## 2023-11-06 ENCOUNTER — Ambulatory Visit: Attending: Cardiology | Admitting: Cardiology

## 2023-11-06 VITALS — BP 100/62 | HR 67 | Resp 16 | Ht 71.0 in

## 2023-11-06 DIAGNOSIS — I471 Supraventricular tachycardia, unspecified: Secondary | ICD-10-CM

## 2023-11-06 DIAGNOSIS — R072 Precordial pain: Secondary | ICD-10-CM

## 2023-11-06 DIAGNOSIS — R002 Palpitations: Secondary | ICD-10-CM

## 2023-11-06 NOTE — Patient Instructions (Signed)
  Follow-Up: At [facility], you and your health needs are our priority.  As part of our continuing mission to provide you with exceptional heart care, our providers are all part of one team.  This team includes your primary Cardiologist (physician) and Advanced Practice Providers or APPs (Physician Assistants and Nurse Practitioners) who all work together to provide you with the care you need, when you need it.  Your next appointment:   1 year(s)  Provider:   Dr. Floretta

## 2024-01-14 ENCOUNTER — Other Ambulatory Visit (HOSPITAL_BASED_OUTPATIENT_CLINIC_OR_DEPARTMENT_OTHER): Payer: Self-pay

## 2024-02-08 ENCOUNTER — Other Ambulatory Visit: Payer: Self-pay

## 2024-02-08 ENCOUNTER — Emergency Department (HOSPITAL_BASED_OUTPATIENT_CLINIC_OR_DEPARTMENT_OTHER)
Admission: EM | Admit: 2024-02-08 | Discharge: 2024-02-09 | Disposition: A | Attending: Emergency Medicine | Admitting: Emergency Medicine

## 2024-02-08 ENCOUNTER — Emergency Department (HOSPITAL_BASED_OUTPATIENT_CLINIC_OR_DEPARTMENT_OTHER)

## 2024-02-08 ENCOUNTER — Encounter (HOSPITAL_BASED_OUTPATIENT_CLINIC_OR_DEPARTMENT_OTHER): Payer: Self-pay | Admitting: *Deleted

## 2024-02-08 DIAGNOSIS — I4891 Unspecified atrial fibrillation: Secondary | ICD-10-CM | POA: Diagnosis not present

## 2024-02-08 DIAGNOSIS — R002 Palpitations: Secondary | ICD-10-CM | POA: Diagnosis present

## 2024-02-08 LAB — CBC
HCT: 50.4 % (ref 39.0–52.0)
Hemoglobin: 17.2 g/dL — ABNORMAL HIGH (ref 13.0–17.0)
MCH: 29.9 pg (ref 26.0–34.0)
MCHC: 34.1 g/dL (ref 30.0–36.0)
MCV: 87.7 fL (ref 80.0–100.0)
Platelets: 194 K/uL (ref 150–400)
RBC: 5.75 MIL/uL (ref 4.22–5.81)
RDW: 13 % (ref 11.5–15.5)
WBC: 11.1 K/uL — ABNORMAL HIGH (ref 4.0–10.5)
nRBC: 0 % (ref 0.0–0.2)

## 2024-02-08 LAB — TROPONIN T, HIGH SENSITIVITY: Troponin T High Sensitivity: <15 ng/L (ref 0–19)

## 2024-02-08 LAB — BASIC METABOLIC PANEL WITH GFR
Anion gap: 15 (ref 5–15)
BUN: 19 mg/dL (ref 6–20)
CO2: 23 mmol/L (ref 22–32)
Calcium: 9.9 mg/dL (ref 8.9–10.3)
Chloride: 101 mmol/L (ref 98–111)
Creatinine, Ser: 0.96 mg/dL (ref 0.61–1.24)
GFR, Estimated: >60 mL/min
Glucose, Bld: 104 mg/dL — ABNORMAL HIGH (ref 70–99)
Potassium: 3.7 mmol/L (ref 3.5–5.1)
Sodium: 139 mmol/L (ref 135–145)

## 2024-02-08 LAB — RESP PANEL BY RT-PCR (RSV, FLU A&B, COVID)  RVPGX2
Influenza A by PCR: NEGATIVE
Influenza B by PCR: NEGATIVE
Resp Syncytial Virus by PCR: NEGATIVE
SARS Coronavirus 2 by RT PCR: NEGATIVE

## 2024-02-08 MED ORDER — APIXABAN 5 MG PO TABS
5.0000 mg | ORAL_TABLET | Freq: Two times a day (BID) | ORAL | 0 refills | Status: AC
  Filled 2024-02-08: qty 60, 30d supply, fill #0

## 2024-02-08 MED ORDER — ONDANSETRON HCL 4 MG/2ML IJ SOLN
INTRAMUSCULAR | Status: AC
  Administered 2024-02-08: 4 mg via INTRAVENOUS
  Filled 2024-02-08: qty 2

## 2024-02-08 MED ORDER — APIXABAN 5 MG PO TABS
5.0000 mg | ORAL_TABLET | Freq: Two times a day (BID) | ORAL | Status: DC
  Administered 2024-02-09: 5 mg via ORAL
  Filled 2024-02-08: qty 1

## 2024-02-08 MED ORDER — ONDANSETRON HCL 4 MG/2ML IJ SOLN: 4.0000 mg | Freq: Once | INTRAMUSCULAR | Status: AC

## 2024-02-08 MED ORDER — METOPROLOL TARTRATE 5 MG/5ML IV SOLN
5.0000 mg | Freq: Once | INTRAVENOUS | Status: AC
  Administered 2024-02-08: 5 mg via INTRAVENOUS

## 2024-02-08 MED ORDER — DILTIAZEM HCL-DEXTROSE 125-5 MG/125ML-% IV SOLN (PREMIX)
5.0000 mg/h | INTRAVENOUS | Status: DC
  Administered 2024-02-08: 5 mg/h via INTRAVENOUS
  Filled 2024-02-08: qty 125

## 2024-02-08 MED ORDER — ETOMIDATE 2 MG/ML IV SOLN
10.0000 mg | Freq: Once | INTRAVENOUS | Status: AC
  Administered 2024-02-08: 10 mg via INTRAVENOUS
  Filled 2024-02-08: qty 10

## 2024-02-08 MED ORDER — DILTIAZEM LOAD VIA INFUSION
20.0000 mg | Freq: Once | INTRAVENOUS | Status: AC
  Administered 2024-02-08: 20 mg via INTRAVENOUS
  Filled 2024-02-08: qty 20

## 2024-02-08 MED ORDER — ACETAMINOPHEN 500 MG PO TABS
1000.0000 mg | ORAL_TABLET | Freq: Once | ORAL | Status: AC
  Administered 2024-02-08: 1000 mg via ORAL
  Filled 2024-02-08: qty 2

## 2024-02-08 NOTE — Sedation Documentation (Signed)
Pt cardioverted at 120J 

## 2024-02-08 NOTE — ED Provider Notes (Signed)
 " [place] [facility]ERGENCY DEPARTMENT AT [facility] Provider Note   CSN: [id number] Arrival date & time: 02/08/24  2116     Patient presents with: Atrial Fibrillation   Harry Anderson is a 34 y.o. male.   34 year old male presents with acute onset of palpitations which can approximate hour prior to arrival.  Patient states that he was out to dinner and had some nausea and vomiting.  Outside diarrhea.  Has A-fib monitor that he used which showed total that was in A-fib.  Since that tracing to his cardiologist.  I can review that in the patient's old records and looks like he was in A-fib.  He denied any chest pain with this has not been short of breath.  Episode of A-fib in the past that resolved after about 24 hours.  Does not take anticoagulants.  Does have history of SVT and is on Lopressor  for that.  He did take 50 mg of Lopressor  this evening.  Took 25 mg in the morning.       Prior to Admission medications  Medication Sig Start Date End Date Taking? Authorizing Provider  buPROPion  (WELLBUTRIN  XL) 150 MG 24 hr tablet Take 1 tablet (150 mg total) by mouth every morning. 07/14/23     lisdexamfetamine  (VYVANSE ) 30 MG chewable tablet Chew 1 tablet (30 mg total) by mouth 2 (two) times daily. 06/06/23     Lisdexamfetamine  Dimesylate 60 MG CHEW Chew 1 tablet (60 mg total) by mouth daily. 01/17/23     meloxicam  (MOBIC ) 7.5 MG tablet Take 1 tablet (7.5 mg total) by mouth daily. 05/15/22   Stoneking, Adine PARAS., MD  metoprolol  succinate (TOPROL -XL) 25 MG 24 hr tablet Take 1 tablet (25 mg total) by mouth daily. 06/12/22     metoprolol  tartrate (LOPRESSOR ) 25 MG tablet Take 1 tablet (25 mg total) by mouth 2 (two) times daily with a meal. 10/28/23 11/26/23      Allergies: Codeine sulfate    Review of Systems  All other systems reviewed and are negative.   Updated Vital Signs BP (!) 128/97   Pulse 79   Resp 17   SpO2 100%   Physical Exam Vitals and nursing note reviewed.   Constitutional:      General: He is not in acute distress.    Appearance: Normal appearance. He is well-developed. He is not toxic-appearing.  HENT:     Head: Normocephalic and atraumatic.  Eyes:     General: Lids are normal.     Conjunctiva/sclera: Conjunctivae normal.     Pupils: Pupils are equal, round, and reactive to light.  Neck:     Thyroid : No thyroid  mass.     Trachea: No tracheal deviation.  Cardiovascular:     Rate and Rhythm: Tachycardia present. Rhythm irregular.     Heart sounds: Normal heart sounds. No murmur heard.    No gallop.  Pulmonary:     Effort: Pulmonary effort is normal. No respiratory distress.     Breath sounds: Normal breath sounds. No stridor. No decreased breath sounds, wheezing, rhonchi or rales.  Abdominal:     General: There is no distension.     Palpations: Abdomen is soft.     Tenderness: There is no abdominal tenderness. There is no rebound.  Musculoskeletal:        General: No tenderness. Normal range of motion.     Cervical back: Normal range of motion and neck supple.  Skin:    General: Skin is warm and dry.  Findings: No abrasion or rash.  Neurological:     Mental Status: He is alert and oriented to person, place, and time. Mental status is at baseline.     GCS: GCS eye subscore is 4. GCS verbal subscore is 5. GCS motor subscore is 6.     Cranial Nerves: No cranial nerve deficit.     Sensory: No sensory deficit.     Motor: Motor function is intact.  Psychiatric:        Attention and Perception: Attention normal.        Speech: Speech normal.        Behavior: Behavior normal.     (all labs ordered are listed, but only abnormal results are displayed) Labs Reviewed  CBC - Abnormal; Notable for the following components:      Result Value   WBC 11.1 (*)    Hemoglobin 17.2 (*)    All other components within normal limits  BASIC METABOLIC PANEL WITH GFR  TROPONIN T, HIGH SENSITIVITY    EKG: EKG  Interpretation Date/Time:  Sunday February 08 2024 21:24:26 EST Ventricular Rate:  169 PR Interval:    QRS Duration:  87 QT Interval:  251 QTC Calculation: 421 R Axis:   82  Text Interpretation: Atrial fibrillation Probable LVH with secondary repol abnrm Anterior Q waves, possibly due to LVH ST depr, consider ischemia, inferior leads Minimal ST elevation, lateral leads Baseline wander in lead(s) V1 Confirmed by Dasie Faden ([id number]) on 02/08/2024 9:40:02 PM  Radiology: No results found.   .Sedation  Date/Time: 02/08/2024 10:45 PM  Performed by: Dasie Faden, MD Authorized by: Dasie Faden, MD   Consent:    Consent obtained:  Written   Consent given by:  Patient Universal protocol:    Immediately prior to procedure, a time out was called: yes     Patient identity confirmed:  Arm band Indications:    Procedure performed:  Cardioversion   Procedure necessitating sedation performed by:  Physician performing sedation Pre-sedation assessment:    Time since last food or drink:  2 hours   ASA classification: class 1 - normal, healthy patient     Mouth opening:  3 or more finger widths   Mallampati score:  I - soft palate, uvula, fauces, pillars visible   Neck mobility: normal     Pre-sedation assessments completed and reviewed: airway patency and cardiovascular function   A pre-sedation assessment was completed prior to the start of the procedure Immediate pre-procedure details:    Reassessment: Patient reassessed immediately prior to procedure     Reviewed: vital signs   Procedure details (see MAR for exact dosages):    Preoxygenation:  Room air   Sedation:  Etomidate    Intended level of sedation: deep   Intra-procedure monitoring:  Blood pressure monitoring, cardiac monitor, continuous pulse oximetry, continuous capnometry, frequent LOC assessments and frequent vital sign checks   Intra-procedure events: emesis     Total Provider sedation time (minutes):  15 Post-procedure  details:   A post-sedation assessment was completed following the completion of the procedure.   Attendance: Constant attendance by certified staff until patient recovered     Recovery: Patient returned to pre-procedure baseline     Patient is stable for discharge or admission: yes     Procedure completion:  Tolerated well, no immediate complications .Cardioversion  Date/Time: 02/08/2024 10:47 PM  Performed by: Dasie Faden, MD Authorized by: Dasie Faden, MD   Consent:    Consent obtained:  Written  Consent given by:  Patient   Risks discussed:  Induced arrhythmia   Alternatives discussed:  No treatment Pre-procedure details:    Cardioversion basis:  Emergent   Rhythm:  Atrial fibrillation Patient sedated: Yes. Refer to sedation procedure documentation for details of sedation.  Attempt one:    Cardioversion mode:  Synchronous   Waveform:  Biphasic   Shock (Joules):  200   Shock outcome:  Conversion to normal sinus rhythm Post-procedure details:    Patient status:  Awake   Patient tolerance of procedure:  Tolerated well, no immediate complications    Medications Ordered in the ED  diltiazem  (CARDIZEM ) 1 mg/mL load via infusion 20 mg (has no administration in time range)    And  diltiazem  (CARDIZEM ) 125 mg in dextrose  5% 125 mL (1 mg/mL) infusion (has no administration in time range)  metoprolol  tartrate (LOPRESSOR ) injection 5 mg (5 mg Intravenous Given 02/08/24 2133)                                    Medical Decision Making Amount and/or Complexity of Data Reviewed Labs: ordered. Radiology: ordered.  Risk OTC drugs. Prescription drug management.   Patient EKG shows atrial fibrillation with rapid ventricular response.  Was given Lopressor  5 mg without any change.  Patient started on Cardizem  after bolus and remained in A-fib.  Decision made to DC cardiovert patient.  Please see above for the details of that.  Patient did have emesis which was treated with  Zofran .  The patient's repeat temperature was 100.7.  Patient symptoms tonight started with having diarrhea as well as emesis.  Suspect viral etiology.  Laboratory studies here show a mild white count 11.1.  Otherwise negative.  Will monitor here.  .  11:13 PM Patient is EKG shows sinus rhythm.  Discussed with cardiology on-call who agrees with plan to start patient on Eliquis  5 mg twice daily for 30 days.  Patient given first dose of Eliquis  here.  Cardiologist will have A-fib clinic call patient.  CRITICAL CARE Performed by: Curtistine ONEIDA Dawn Total critical care time: 60 minutes Critical care time was exclusive of separately billable procedures and treating other patients. Critical care was necessary to treat or prevent imminent or life-threatening deterioration. Critical care was time spent personally by me on the following activities: development of treatment plan with patient and/or surrogate as well as nursing, discussions with consultants, evaluation of patient's response to treatment, examination of patient, obtaining history from patient or surrogate, ordering and performing treatments and interventions, ordering and review of laboratory studies, ordering and review of radiographic studies, pulse oximetry and re-evaluation of patient's condition.     Final diagnoses:  None    ED Discharge Orders     None          Dawn Curtistine, MD 02/08/24 2313  "

## 2024-02-08 NOTE — Sedation Documentation (Signed)
Unable to rate pain due to procedure.  

## 2024-02-08 NOTE — ED Notes (Signed)
 Once pt woke up he vomited a large amount, over a liter of undigested food.  Pt cleaned up and linens changed.  Pt given zofran .  Pt has low grade temp, EDP notified and medication given.

## 2024-02-08 NOTE — ED Notes (Signed)
 Call to room 3 for cardioversion, Sx set up ,ambu bag at head of bed, CO2 monitor on with 2lts FIO2. Pt tolerated procedure well. Pt came out of afib back to normal sinus rhythm. SATS stable throughout.

## 2024-02-08 NOTE — ED Triage Notes (Addendum)
 Pt with hx of SVT and afib is here for HR up to 190.  Pt states that he was out to dinner and the meal made him nauseated and he vomited after which his heart started racing (vomiting triggers his tachycardia)

## 2024-02-08 NOTE — Discharge Instructions (Addendum)
 Your COVID, flu, and RSV swabs were all negative, but still suspect you have a viral infection.  Take Tylenol  1000 mg rotated with ibuprofen  600 mg every 3 hours as needed for fever.  Drink plenty of fluids and get plenty of rest.  Follow-up with cardiology in the next week.  The A-fib clinic should call you to make these arrangements.

## 2024-02-08 NOTE — Sedation Documentation (Signed)
 Pt vomited a large amount after cardioversion. Dr. Dasie aware.

## 2024-02-09 ENCOUNTER — Other Ambulatory Visit (HOSPITAL_BASED_OUTPATIENT_CLINIC_OR_DEPARTMENT_OTHER): Payer: Self-pay

## 2024-02-09 MED ORDER — ONDANSETRON 8 MG PO TBDP
ORAL_TABLET | ORAL | 0 refills | Status: AC
  Filled 2024-02-09: qty 9, 2d supply, fill #0

## 2024-02-09 MED ORDER — ONDANSETRON 4 MG PO TBDP
4.0000 mg | ORAL_TABLET | Freq: Once | ORAL | Status: AC
  Administered 2024-02-09: 4 mg via ORAL
  Filled 2024-02-09: qty 1

## 2024-02-09 MED ORDER — IBUPROFEN 400 MG PO TABS
600.0000 mg | ORAL_TABLET | Freq: Once | ORAL | Status: AC
  Administered 2024-02-09: 600 mg via ORAL

## 2024-02-09 NOTE — ED Notes (Addendum)
 Discharge instructions were provided by E. Flueckiger, CHARITY FUNDRAISER.  Pt felt a little nausea at d/c,medicated by Leeroy per order.  Dr. Geroldine aware. No change in pt's d/c plan. Pt was taken to his car by Leeroy in a wheelchair and was driven home by his family member.

## 2024-02-16 ENCOUNTER — Ambulatory Visit (HOSPITAL_COMMUNITY)
Admission: RE | Admit: 2024-02-16 | Discharge: 2024-02-16 | Disposition: A | Discharge status: Home / Self Care | Source: Ambulatory Visit | Attending: Physician Assistant | Admitting: Physician Assistant

## 2024-02-16 ENCOUNTER — Encounter (HOSPITAL_COMMUNITY): Payer: Self-pay | Admitting: Physician Assistant

## 2024-02-16 ENCOUNTER — Other Ambulatory Visit (HOSPITAL_BASED_OUTPATIENT_CLINIC_OR_DEPARTMENT_OTHER): Payer: Self-pay

## 2024-02-16 VITALS — BP 98/60 | HR 64 | Ht 71.0 in | Wt 189.4 lb

## 2024-02-16 DIAGNOSIS — I4819 Other persistent atrial fibrillation: Secondary | ICD-10-CM

## 2024-02-16 DIAGNOSIS — I480 Paroxysmal atrial fibrillation: Secondary | ICD-10-CM

## 2024-02-16 DIAGNOSIS — I4891 Unspecified atrial fibrillation: Secondary | ICD-10-CM | POA: Diagnosis not present

## 2024-02-16 HISTORY — DX: Unspecified atrial fibrillation: I48.91

## 2024-02-16 MED ORDER — METOPROLOL TARTRATE 50 MG PO TABS
50.0000 mg | ORAL_TABLET | Freq: Two times a day (BID) | ORAL | 11 refills | Status: DC
  Filled 2024-02-16: qty 60, 30d supply, fill #0

## 2024-02-16 MED ORDER — METOPROLOL TARTRATE 25 MG PO TABS
25.0000 mg | ORAL_TABLET | Freq: Two times a day (BID) | ORAL | 11 refills | Status: AC
  Filled 2024-02-16: qty 60, 30d supply, fill #0

## 2024-02-16 NOTE — Progress Notes (Addendum)
 "   Primary Care Physician: Regino Slater, MD Primary Cardiologist: Alvan Ronal BRAVO, MD (Inactive) Electrophysiologist: None  Referring Physician: ED   Harry Anderson is a 34 y.o. male with a history of SVT and atrial fibrillation who presents for follow up in the [facility].  The patient was initially diagnosed with atrial fibrillation 02/08/24 after presenting to the ED with symptoms of tachypalpitations. He had been maintained on BB for his SVT. ECG at the ED showed afib with RVR and he underwent DCCV at that time. He was ill with nausea/vomiting just prior to the onset of his afib. Patient was started on Eliquis  for stroke prevention.    Patient presents today for follow up for atrial fibrillation. He is in SR today and feels well. He reports that he had a previous episode of afib about one year ago related to nausea and vomiting also. He does snore and had a sleep study which did not show OSA. He drinks alcohol infrequently. No bleeding issues on anticoagulation.   Today, he denies symptoms of palpitations, chest pain, shortness of breath, orthopnea, PND, lower extremity edema, dizziness, presyncope, syncope, daytime somnolence, bleeding, or neurologic sequela. The patient is tolerating medications without difficulties and is otherwise without complaint today.    Atrial Fibrillation Risk Factors:  he does not have symptoms or diagnosis of sleep apnea. he does not have a history of rheumatic fever. he does not have a history of alcohol use. The patient does have a history of early familial atrial fibrillation or other arrhythmias. Father had Vfib s/p ICD, mother has afib.   Atrial Fibrillation Management history:  Previous antiarrhythmic drugs: none Previous cardioversions: 02/08/24 Previous ablations: none Anticoagulation history: Eliquis   ROS- All systems are reviewed and negative except as per the HPI above.  Past Medical History:  Diagnosis Date    Anxiety    Atrial fibrillation (HCC)    SVT (supraventricular tachycardia)     Current Outpatient Medications  Medication Sig Dispense Refill   apixaban  (ELIQUIS ) 5 MG TABS tablet Take 1 tablet (5 mg total) by mouth 2 (two) times daily. 60 tablet 0   buPROPion  (WELLBUTRIN  XL) 150 MG 24 hr tablet Take 1 tablet (150 mg total) by mouth every morning. 90 tablet 3   meloxicam  (MOBIC ) 7.5 MG tablet Take 1 tablet (7.5 mg total) by mouth daily. 10 tablet 1   metoprolol  tartrate (LOPRESSOR ) 25 MG tablet Take 1 tablet (25 mg total) by mouth 2 (two) times daily. 60 tablet 11   ondansetron  (ZOFRAN -ODT) 8 MG disintegrating tablet dissolve 8 mg in mouth  every 4 hours as needed for  nausea 10 tablet 0   No current facility-administered medications for this encounter.    Physical Exam: BP 98/60 (BP Location: Right Arm, Patient Position: Sitting, Cuff Size: Normal)   Pulse 64   Ht 5' 11 (1.803 m)   Wt 85.9 kg   SpO2 97%   BMI 26.42 kg/m   GEN: Well nourished, well developed in no acute distress CARDIAC: Regular rate and rhythm, no murmurs, rubs, gallops RESPIRATORY:  Clear to auscultation without rales, wheezing or rhonchi  ABDOMEN: Soft, non-tender, non-distended EXTREMITIES:  No edema; No deformity   Wt Readings from Last 3 Encounters:  02/16/24 85.9 kg  09/05/23 84.3 kg  10/25/22 83.7 kg     EKG Interpretation Date/Time:  Monday February 16 2024 09:44:52 EST Ventricular Rate:  64 PR Interval:  142 QRS Duration:  100 QT Interval:  400  QTC Calculation: 412 R Axis:   73  Text Interpretation: Normal sinus rhythm Minimal voltage criteria for LVH, may be normal variant ( Sokolow-Lyon ) Early repolarization Borderline ECG When compared with ECG of 08-Feb-2024 22:27, No significant change was found Confirmed by Kellye Mizner ([id number]) on 02/16/2024 9:53:21 AM    Echo 10/31/23 demonstrated   1. Left ventricular ejection fraction, by estimation, is 60 to 65%. Left  ventricular ejection fraction  by 3D volume is 60 %. The left ventricle has  normal function. The left ventricle has no regional wall motion  abnormalities. Left ventricular diastolic parameters were normal. The average left ventricular global longitudinal strain is -27.5 %. The global longitudinal strain is normal.   2. Right ventricular systolic function is normal. The right ventricular  size is normal.   3. The mitral valve is normal in structure. No evidence of mitral valve  regurgitation. No evidence of mitral stenosis.   4. The aortic valve is tricuspid. Aortic valve regurgitation is not  visualized. No aortic stenosis is present.   5. The inferior vena cava is normal in size with greater than 50%  respiratory variability, suggesting right atrial pressure of 3 mmHg.     CHA2DS2-VASc Score = 0  The patient's score is based upon: CHF History: 0 HTN History: 0 Diabetes History: 0 Stroke History: 0 Vascular Disease History: 0 Age Score: 0 Gender Score: 0       ASSESSMENT AND PLAN: Paroxysmal Atrial Fibrillation (ICD10:  I48.0) The patient's CHA2DS2-VASc score is 0, indicating a 0.2% annual risk of stroke.   S/p DCCV in ED on 02/08/24 General education about afib provided and questions answered. We also discussed his stroke risk and the risks and benefits of anticoagulation. Continue Eliquis  5 mg BID for at least 4 weeks post DCCV then discontinue with low CV score.  We discussed rhythm control options including AAD (flecainide, Multaq) and ablation. Would favor ablation given his young age and lack of comorbidities. Patient agreeable to consultation with EP.  Continue Lopressor  25 mg BID  SVT Continue Lopressor  25 mg BID    Follow up with EP to establish care and discuss ablation.     [facility] [address] [place], [place] [address] [phone] "

## 2024-02-17 ENCOUNTER — Other Ambulatory Visit (HOSPITAL_COMMUNITY): Payer: Self-pay

## 2024-02-17 MED ORDER — ALPRAZOLAM 0.25 MG PO TABS
0.2500 mg | ORAL_TABLET | Freq: Every day | ORAL | 0 refills | Status: AC | PRN
  Filled 2024-02-17: qty 10, 10d supply, fill #0

## 2024-02-17 MED ORDER — BUSPIRONE HCL 10 MG PO TABS
10.0000 mg | ORAL_TABLET | Freq: Two times a day (BID) | ORAL | 0 refills | Status: AC
  Filled 2024-02-17: qty 60, 30d supply, fill #0

## 2024-02-24 NOTE — Progress Notes (Unsigned)
 " Electrophysiology Office Note:    Date:  02/25/2024   ID:  Harry Anderson, DOB 09-15-1990, MRN [id number]  PCP:  Regino Slater, MD    [facility] Providers Cardiologist:  Alvan Ronal BRAVO, MD (Inactive)     Referring MD: Nellene Quita SAUNDERS, PA   History of Present Illness:    Harry Anderson is a 34 y.o. male with a medical history significant for paroxysmal atrial fibrillation and SVT, referred for arrhythmia management.     He has a diagnosis of SVT.  This was managed with metoprolol . He has been on Adderall in the past   With initially diagnosed with atrial fibrillation in January 2026 after presenting to the ED with symptoms of tachypalpitations.  DC cardioversion was performed.  A-fib was preceded by nausea and vomiting.  He has had an episode of atrial fibrillation about a year ago also related nausea and vomiting.  He had a sleep study that did not show sleep apnea.  Discussed the use of AI scribe software for clinical note transcription with the patient, who gave verbal consent to proceed.  History of Present Illness  He presented to the ER in January 2026 with tachypalpitations, was found to be in atrial fibrillation and treated with direct current cardioversion.  This was the second episode of atrial fibrillation, both were preceded by nausea and vomiting. The first occurred about a year ago and resolved spontaneously overnight. The second occurred two weeks ago and required cardioversion.  He had a diagnosis of SVT since 2011, initially associated with illness and bearing down, with syncope. His heart rate at this time was about 200 bpm. Prior cardiology evaluation and monitoring in [place]  identified SVT but we don't have these details. These episodes have markedly decreased since 2012, and he has not noticed recent significant racing heart.  He is currently taking metoprolol . Family history is notable for his mother with pacemaker and strokes and his father  with cardiac arrest and an ICD.         Today, he reports he feels well and has no acute complaints  EKGs/Labs/Other Studies Reviewed Today:     Echocardiogram:  TTE October 31, 2023 LVEF 60 to 65%.  Normal RV systolic function.  Normal mitral and aortic valves   Monitors:  14 day monitor November 2022-- my interpretation Sinus rhythm, heart rate 49 to 196 bpm, average 89 bpm Ectopy burden less than 1% Symptom episodes appear to show sinus tachycardia  Stress testing:  Exercise tolerance test August 2025 Nonspecific EKG changes with baseline T wave inversion and LVH  Advanced imaging:  CT coronary September 2025 Coronary calcium score is 1    EKG:   EKG Interpretation Date/Time:  Wednesday February 25 2024 09:21:54 EST Ventricular Rate:  63 PR Interval:  136 QRS Duration:  102 QT Interval:  408 QTC Calculation: 417 R Axis:   76  Text Interpretation: Normal sinus rhythm with sinus arrhythmia Minimal voltage criteria for LVH, may be normal variant ( Sokolow-Lyon ) Septal infarct , age undetermined When compared with ECG of 16-Feb-2024 09:44, Inferior T wave abnormality Confirmed by Nancey Scotts [phone]) on 02/25/2024 9:32:25 AM     Physical Exam:    VS:  BP 118/80 (BP Location: Right Arm, Patient Position: Sitting, Cuff Size: Normal)   Pulse 63   Ht 5' 11 (1.803 m)   Wt 186 lb (84.4 kg)   SpO2 97%   BMI 25.94 kg/m     Wt Readings from Last  3 Encounters:  02/25/24 186 lb (84.4 kg)  02/16/24 189 lb 6.4 oz (85.9 kg)  09/05/23 185 lb 12.8 oz (84.3 kg)     GEN: Well nourished, well developed in no acute distress CARDIAC: RRR, no murmurs, rubs, gallops RESPIRATORY:  Normal work of breathing MUSCULOSKELETAL: no edema    ASSESSMENT & PLAN:     Persistent atrial fibrillation Patient underwent cardioversion shortly after arrhythmia onset due to severe symptoms and rapid rates, he may have converted to sinus spontaneously Rates are very high in atrial  fibrillation This is the second episode, per patient We discussed that he is a bit of an unusual case, with episodes triggered by nausea.  I explained management options which could include avoiding triggers. He would like to be aggressively proactive in preventing further episodes of arrhythmia and AF. Will perform CT With a history of SVT, will plan to use the carto system for mapping and ablation of SVT, if present. Will plan to use the Farapulse catheter for PVI.  History of SVT The single monitor available to us  showed only sinus tachycardia We will perform a complete electrophysiology study at the time of this procedure.  Will look for non-pulmonary vein triggers and SVT substrate.  Secondary hypercoagulable state Chads 2 VASc score 0 Continue apixaban  5 mg p.o. twice daily in the periprocedural period     Signed, Eulas FORBES Furbish, MD  02/25/2024 9:48 AM    [facility] "

## 2024-02-25 ENCOUNTER — Ambulatory Visit: Attending: Cardiology | Admitting: Cardiovascular Disease

## 2024-02-25 ENCOUNTER — Encounter: Payer: Self-pay | Admitting: Cardiovascular Disease

## 2024-02-25 VITALS — BP 118/80 | HR 63 | Ht 71.0 in | Wt 186.0 lb

## 2024-02-25 DIAGNOSIS — Z01812 Encounter for preprocedural laboratory examination: Secondary | ICD-10-CM | POA: Diagnosis not present

## 2024-02-25 DIAGNOSIS — R002 Palpitations: Secondary | ICD-10-CM | POA: Diagnosis not present

## 2024-02-25 DIAGNOSIS — I471 Supraventricular tachycardia, unspecified: Secondary | ICD-10-CM

## 2024-02-25 DIAGNOSIS — I4819 Other persistent atrial fibrillation: Secondary | ICD-10-CM

## 2024-02-25 NOTE — Patient Instructions (Signed)
 Medication Instructions:  Your physician recommends that you continue on your current medications as directed. Please refer to the Current Medication list given to you today.  *If you need a refill on your cardiac medications before your next appointment, please call your pharmacy*  Testing/Procedures: Ablation Your physician has recommended that you have an ablation. Catheter ablation is a medical procedure used to treat some cardiac arrhythmias (irregular heartbeats). During catheter ablation, a long, thin, flexible tube is put into a blood vessel in your groin (upper thigh), or neck. This tube is called an ablation catheter. It is then guided to your heart through the blood vessel. Radio frequency waves destroy small areas of heart tissue where abnormal heartbeats may cause an arrhythmia to start.   You are scheduled for Atrial Fibrillation Ablation on Thursday, March 26 with Dr. Dr. Nancey. Please arrive at the Main Entrance A at [facility]: [address] [place], [place] [address] at 5:30 AM   What To Expect:  Labs: you will need to have lab work drawn within 30 days of your procedure. Please go to any [facility] location to have these drawn - no appointment is needed. Cardiac CT Scan: this will be done about 3-4 weeks prior to your procedure. You will be contacted to schedule this test. You will receive procedure instructions either through MyChart or in the mail 4-6 week prior to your procedure.  After your procedure we recommend no driving for 4 days, no lifting over 5 lbs for 7 days, and no work or strenuous activity for 7 days.  Please contact our office at [phone] if you have any questions.    Follow-Up: We will contact you to schedule your post-procedure appointments.  Your Cardiac CT has been scheduled on               at the following location:  [facility] [address]  [place], [place] [address]  Please enter the parking lot using  the [address] entrance and use the FREE valet service at the patient drop-off area. Enter the buidling and check-in with registration on the main floor.  Please follow these instructions carefully (unless otherwise directed):  An IV will be required for this test.  On the Night Before the Test: Be sure to Drink plenty of water. Do not consume any caffeinated/decaffeinated beverages or chocolate 12 hours prior to your test. Do not take any antihistamines 12 hours prior to your test.   On the Day of the Test: Drink plenty of water until 1 hour prior to the test. Do not eat any food 1 hour prior to test. You may take your regular medications prior to the test.  If you take Furosemide/Hydrochlorothiazide/Spironolactone/Chlorthalidone, please HOLD on the morning of the test. Patients who wear a continuous glucose monitor MUST remove the device prior to scanning. FEMALES- please wear underwire-free bra if available, avoid dresses & tight clothing  After the Test: Drink plenty of water. After receiving IV contrast, you may experience a mild flushed feeling. This is normal. On occasion, you may experience a mild rash up to 24 hours after the test. This is not dangerous. If this occurs, you can take Benadryl 25 mg, Zyrtec, Claritin, or Allegra and increase your fluid intake. (Patients taking Tikosyn should avoid Benadryl, and may take Zyrtec, Claritin, or Allegra) If you experience trouble breathing, this can be serious. If it is severe call 911 IMMEDIATELY. If it is mild, please call our office.  For more information and frequently asked questions, please visit our website : [email or web address]  For non-scheduling related questions, please contact the cardiac imaging nurse navigator should you have any questions/concerns: Cardiac Imaging Nurse Navigators Direct Office Dial: [phone]   For scheduling needs, including cancellations and rescheduling, please call Brittany,  [phone].

## 2024-02-26 ENCOUNTER — Other Ambulatory Visit (HOSPITAL_BASED_OUTPATIENT_CLINIC_OR_DEPARTMENT_OTHER): Payer: Self-pay

## 2024-02-27 ENCOUNTER — Encounter: Payer: Self-pay | Admitting: Cardiovascular Disease

## 2024-03-02 ENCOUNTER — Encounter: Payer: Self-pay | Admitting: *Deleted

## 2024-03-02 ENCOUNTER — Telehealth: Payer: Self-pay

## 2024-03-02 NOTE — Telephone Encounter (Signed)
-----   Message from Nurse Aldona SAUNDERS, RN sent at 02/26/2024  9:30 AM EST ----- Regarding: 03/26 AFib Mealor Important: list procedure date as first item in subject line, followed by procedure type (e.g., 10/10/23 AFib ablation)  Precert:  MD: Mealor Type of ablation: A-fib Diagnosis: Afib CPT code: A-fib (06343) Ablation scheduled (date/time): 03/26  Procedure:  Added to calendar? Yes Orders entered? No, >30 days before procedure Letter complete? No, >30 days before procedure Scheduled with cath lab? Yes Any medications to hold? Routine Labs ordered (CBC, BMET, PT/INR if on warfarin): Yes Mapping system: Doesn't matter CARTO/OPAL rep notified? No Cardiac CT needed? Yes, ordered Dye allergy? No Pre-meds ordered and instructions given? N/A Letter method: MyChart H&P: 01/28 Device: No  Follow-up:  Cassie/Angel, please schedule Routine.  Covering RN - please send this message to [employer], EP scheduler, EP Scheduling pool, EP [employer], and CT scheduler (Brittany Lynch/Stephanie Mogg), if indicated.

## 2024-04-01 ENCOUNTER — Other Ambulatory Visit (HOSPITAL_COMMUNITY)

## 2024-04-22 ENCOUNTER — Encounter (HOSPITAL_COMMUNITY): Admission: RE | Payer: Self-pay | Source: Home / Self Care

## 2024-04-22 ENCOUNTER — Ambulatory Visit (HOSPITAL_COMMUNITY): Admission: RE | Admit: 2024-04-22 | Source: Home / Self Care | Admitting: Cardiovascular Disease
